# Patient Record
Sex: Male | Born: 1970 | Race: White | Hispanic: No | Marital: Married | Smoking: Never smoker
Health system: Southern US, Community
[De-identification: ages and names within clinical notes are randomized; demographics above are authoritative.]

## PROBLEM LIST (undated history)

## (undated) DIAGNOSIS — I10 Essential (primary) hypertension: Secondary | ICD-10-CM

## (undated) DIAGNOSIS — F99 Mental disorder, not otherwise specified: Secondary | ICD-10-CM

## (undated) DIAGNOSIS — T7840XA Allergy, unspecified, initial encounter: Secondary | ICD-10-CM

## (undated) DIAGNOSIS — T4145XA Adverse effect of unspecified anesthetic, initial encounter: Secondary | ICD-10-CM

## (undated) DIAGNOSIS — R51 Headache: Secondary | ICD-10-CM

## (undated) DIAGNOSIS — T8859XA Other complications of anesthesia, initial encounter: Secondary | ICD-10-CM

## (undated) HISTORY — PX: TONSILLECTOMY: SUR1361

## (undated) HISTORY — PX: SPINE SURGERY: SHX786

## (undated) HISTORY — DX: Allergy, unspecified, initial encounter: T78.40XA

## (undated) HISTORY — PX: KNEE ARTHROSCOPY: SUR90

---

## 2011-03-09 ENCOUNTER — Ambulatory Visit (INDEPENDENT_AMBULATORY_CARE_PROVIDER_SITE_OTHER): Payer: BC Managed Care – PPO

## 2011-03-09 DIAGNOSIS — J159 Unspecified bacterial pneumonia: Secondary | ICD-10-CM

## 2011-04-17 NOTE — H&P (Addendum)
David Gonzalez 04/17/2011 2:35 PM Location: [facility] Patient #: [id number] DOB: February 10, 1971 Married / Language: [place] / Race: White Male   History of Present Illness(DIANA Dierdre Highman, PA-C; 04/17/2011 3:12 PM) The patient is a 41 year old male who comes in today for a preoperative History and Physical. 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 Debria Garret D. Shon Baton, MD at [facility] on Wednesday, April 29, 2010 at 8:30AM .    Problem List/Past Medical(DIANA Dierdre Highman, PA-C; 04/17/2011 2:52 PM) Cervical radiculopathy (723.4) Pain, Cervical (723.1) *RIGHT. 07/25/1985 *SPRAIN/STRAIN UNSPEC.KNEE & LEG. 07/25/1985 *BILATERAL. 09/28/1985 Cellulitis/abscess, toe, onychia/paronychia (681.11). 09/28/1985 Sprain/strain, foot NOS (845.10). 05/09/1987 Derangement, internal, knee NOS (717.9). 10/19/1987 Derangement, meniscus NEC (717.5). 11/07/1987 Tear, lateral meniscus, knee, current (836.1). 11/07/1987 Tear, medial meniscus, knee, current (836.0). 11/07/1987 Sprain/strain, ankle NOS (845.00). 06/10/1990 *LEFT. 06/10/1990 *FX PHALANX MIDDLE CLOSED. 10/29/1993 Sprain/strain, wrist NOS (842.00). 03/13/1994 Sprain/strain, lumbar region (847.2). 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 08/08/1996 VERIFY WITH PT. RELAFEN. 08/08/1996 VERIFY W/PT. HYDROCODONE. 05/18/2009 CODEINE. 05/18/2009   Family History(DIANA J KOVACH, PA-C; 04/17/2011 2:52 PM) Bleeding disorder. mother Cancer. father Diabetes Mellitus. mother Rheumatoid Arthritis. mother Hypertension. mother, father and sister   Social History(DIANA Dierdre Highman, PA-C; 04/17/2011 2:52 PM) Drug/Alcohol Rehab (Currently). no Living situation. live with spouse Tobacco / smoke exposure. no Illicit drug use. no Children. 2 Current work status. working full  time Tobacco use. never smoker Alcohol use. never consumed alcohol Marital status. married Number of flights of stairs before winded. 4-5 Pain Contract. no Exercise. Exercises rarely Drug/Alcohol Rehab (Previously). no   Medication History(DIANA J KOVACH, PA-C; 04/17/2011 2:50 PM) Lisinopril (10MG  Tablet, 1 Oral daily) Active. Claritin (1 Oral daily) Specific dose unknown - Active. TraMADol HCl (50MG  Tablet, 1 Oral q 6 hours) Active.   Past Surgical History(DIANA J [facility], PA-C; 04/17/2011 2:52 PM) Arthroscopy of Knee. right Straighten Nasal Septum   Other Problems(DIANA J KOVACH, PA-C; 04/17/2011 2:52 PM) High blood pressure   Review of Systems(DIANA J KOVACH, PA-C; 04/17/2011 2:37 PM) General:Not Present- Chills, Fever, Night Sweats, Appetite Loss, Fatigue, Feeling sick, Weight Gain and Weight Loss. Skin:Not Present- Itching, Rash, Skin Color Changes, Ulcer, Psoriasis and Change in Hair or Nails. HEENT:Not Present- Sensitivity to light, Hearing problems, Nose Bleed and Ringing in the Ears. Neck:Not Present- Swollen Glands and Neck Mass. Respiratory:Not Present- Snoring, Chronic Cough, Bloody sputum and Dyspnea. Cardiovascular:Not Present- Shortness of Breath, Chest Pain, Swelling of Extremities, Leg Cramps and Palpitations. Gastrointestinal:Not Present- Bloody Stool, Heartburn, Abdominal Pain, Vomiting, Nausea and Incontinence of Stool. Male Genitourinary:Not Present- Blood in Urine, Frequency, Incontinence and Nocturia. Musculoskeletal:Present- Muscle Pain. Not Present- Muscle Weakness, Joint Stiffness, Joint Swelling, Joint Pain and Back Pain. Neurological:Present- Tingling. Not Present- Numbness, Burning, Tremor, Headaches and Dizziness. Psychiatric:Not Present- Anxiety, Depression and Memory Loss. Endocrine:Not Present- Cold Intolerance, Heat Intolerance, Excessive hunger and Excessive Thirst. Hematology:Not Present- Abnormal Bleeding, Anemia,  Blood Clots and Easy Bruising.   Physical Exam(DIANA J [facility], PA-C; 04/17/2011 3:23 PM) The physical exam findings are as follows:   General General Appearance- pleasant. Not in acute distress. Orientation- Oriented X3. Build & Nutrition- Well nourished and Well developed. Posture- Normal posture. Gait- Normal. Mental Status- Alert.   Integumentary General Characteristics:Surgical Scars- no surgical scar evidence of previous cervical surgery. Cervical Spine- Skin examination of the cervical spine is without deformity, skin lesions, lacerations or abrasions.   Head and Neck  Neck Global Assessment- supple. no lymphadenopathy and no nucchal rigidty.   Eye Pupil- Bilateral- Normal, Direct reaction to light normal, Equal and Regular. Motion- Bilateral- EOMI.   Chest and Lung Exam Auscultation: Breath sounds:- Clear.   Cardiovascular Auscultation:Rhythm- Regular rate and rhythm. Heart Sounds- Normal heart sounds.   Abdomen Palpation/Percussion:Palpation and Percussion of the abdomen reveal - Non Tender, No Rebound tenderness and Soft.   Peripheral Vascular Upper Extremity: Palpation:Radial pulse- Bilateral- 2+.   Neurologic Sensation:Upper Extremity- Bilateral- sensation is intact in the upper extremity. Lower Extremity- Bilateral- sensation is intact in the lower extremity. Reflexes:Biceps Reflex- Bilateral- 1+. Brachioradialis Reflex- Bilateral- 1+. Triceps Reflex- Bilateral- 1+. Babinski- Bilateral- Babinski not present. Clonus- Bilateral- clonus not present. Hoffman's Sign- Bilateral- Hoffman's sign not present.   Musculoskeletal Spine/Ribs/Pelvis Cervical Spine : Inspection and Palpation:Tenderness- no soft tissue tenderness to palpation and no bony tenderness to palpation. bony/soft tissue palpation of the cervical spine and shoulders does not recreate their typical pain. Strength and  Tone: Strength:Deltoid- Bilateral- 5/5. Biceps- Bilateral- 5/5. Triceps- Bilateral- 5/5. Wrist Extension- Bilateral- 5/5. Hand Grip- Bilateral- 5/5. Heel walk- Bilateral- able to heel walk without difficulty. Toe Walk- Bilateral- able to walk on toes without difficulty. Heel-Toe Walk- Bilateral- able to heel-toe walk without difficulty. ROM- Flexion- Moderately Decreased and painful. Extension- Moderately Decreased and painful. Left Lateral Flexion - Moderately Decreased and painful. Right Lateral Flexion - Moderately Decreased and painful. Left Rotation - Moderately Decreased and painful. Right Rotation - Moderately Decreased and painful. Pain:- neither flexion or extension is more painful than the other. Special Testing- axial compression test negative and cross chest impingement test negative. Non-Anatomic Signs- No non-anatomic signs present. Upper Extremity Range of Motion:- No truesholder pain with IR/ER of the shoulders.   Assessment & Plan(DIANA J [facility], PA-C; 04/17/2011 3:08 PM) Note: unfortunately conservative measures consisting of observation, activity modification, oral pain medication and injection therapy have failed to alleviate his symptoms and given the ongoing nature of his pain and the decrease in his quality of life, he wishes to proceed with surgery. Risk/benefits/alternatives/expectations following surgery have been reviewed with the patient by Dr. Shon Baton.  MRI of the cervical spine dated 03/07/11 demonstrates moderate right sided disc herniation. Moderate right posterolateral herniation extends into the right neural foramen. Sever right foraminal stenosis. Asymmetric right lateral recess stenosis with mass effect on the C6 root.   He has not yet been contacted about the ASPEN collar and I have informed him that our physical therapy department likely will be contacting him to set up an appointment. He is scheduled to complete his pre-op hospital  requirements next Thursday.   All of his questions were encouraged, addressed and answered. Plan, at this time is to proceed with surgery as scheduled.   Signed electronically by Gwinda Maine, PA-C (04/17/2011 3:29 PM)  David Gonzalez 04/03/2011 3:51 PM Location: [facility] Patient #: [id number] DOB: 1970/05/30 Married / Language: English / Race: White Male   History of Present Illness(Debbie G Vivona; 04/03/2011 3:51 PM) The patient is a 41 year old male who presents today for follow up of their neck. The patient is being followed for their neck pain and 2 weeks s/p esi.    Subjective Transcription(Nailea Whitehorn Sheela Stack, MD; 04/04/2011 2:05 PM)  I have had a very long discussion with the patient and his wife concerning his cervical pain. At this point he states that he only had temporary relief with the injection and his overall pain and quality of life is  progressively getting worse. AT this point even though there is some disease at the 4-5 and 6-7 levels, it is not severe and I do not think it is worthwhile doing a multilevel procedure.      Allergies(Debbie G Vivona; 04/03/2011 3:52 PM) HYDROCODONE. 05/18/2009 CODEINE. 05/18/2009 VICODIN. 08/08/1996 VERIFY WITH PT. RELAFEN. 08/08/1996 VERIFY W/PT.   Social History(Debbie G Vivona; 04/03/2011 3:52 PM) Drug/Alcohol Rehab (Previously). no Exercise. Exercises rarely Illicit drug use. no Children. 2 Current work status. working full time Drug/Alcohol Rehab (Currently). no Living situation. live with spouse Tobacco / smoke exposure. no Tobacco use. never smoker Marital status. married Number of flights of stairs before winded. 4-5 Pain Contract. no Alcohol use. never consumed alcohol   Medication History(Debbie G Vivona; 04/03/2011 3:52 PM) Claritin ( Oral) Specific dose unknown - Active. Ibuprofen (800MG  Tablet, Oral) Active. Lisinopril (10MG  Tablet, Oral) Active. TraMADol HCl (50MG   Tablet, 1 (one) Oral three times daily, Taken starting 03/14/2011) Active. Valium (5MG  Tablet, 1 Oral take 1 tablet 1 hour before exam. take second tablet 30 mins before exam., Taken starting 03/04/2011) Active.   Vitals(Debbie G Vivona; 04/03/2011 3:52 PM) 04/03/2011 3:52 PM Weight: 347 lb Height: 81 in Body Surface Area: 3 m Body Mass Index: 37.18 kg/m    Objective Transcription(Ashly Yepez D Eyvette Cordon, MD; 04/04/2011 2:05 PM)  He has radicular arm pain in the C6 distribution. He has 5-6 hard disc osteophyte causing nerve compression on his MRI. He has failed conservative management consisting of injection therapy, physical therapy, activity modifications and medications.      Assessment & Plan(DIANA J [facility], PA-C; 04/03/2011 10:12 PM) Cervical radiculopathy (723.4) Current Plans l Risks of surgery include, but are not limited to: Throat pain,swallowing difficulty, hoarseness or change in voice, Death, stroke, paralysis, nerve root damage/injury, bleeding, blood clots, loss of bowel/bladder control, hardware failure, or malposition, spinal fluid leak, adjacent segment disease, non-union, need for further surgery, ongoing or worse pain, infection and recurrent disc herniation  l We have gone over the risks and benefits of surgery, which include infection, bleeding, nerve damage, death, stroke, paralysis, failure to heal, need for further surgery, ongoing or worse pain, loss of fixation, need for further surgery, CSF leak, loss of bowel or bladder control, ongoing or worse pain.   Pain, Cervical (723.1)  Note: follow up following right C6 SNRB. Unfortunately only about 4 days of decreased not alleviated pain.  At this time, the patient feels he can no longer continue in his current state of pain and wishes to proceed with surgical intervention. Dr. Shon Baton has discussed with the patient and his wife the results of the MRI and the surgical procedure of choice which would be  an ACDF C5-6. The surgical procedure was discussed in great detail by both myself and Dr. Shon Baton with the patient and his wife. All of their questions were encouraged, addressed and answered. I have provided then with the webpage for them to review as well as our email address for additional questions that may arise. We will go ahead and begin the surgical booking process at this time per the patients request.   Plans Transcription(Adalyn Pennock D Ples Trudel, MD; 04/04/2011 2:05 PM)  I think he is a good candidate for surgical intervention. I have discussed the risks which include infection, bleeding, nerve damage, death, stroke, paralysis, failure to heal, need for further surgery, ongoing or worse pain, loss of bowel and bladder control, nonunion, adjacent segment disease, throat pain, swallowing difficulties, hoarseness in the voice. Both he and  his wife have expressed complete understanding of the risks and benefits and expectations. All of their questions were encouraged and addressed.      Miscellaneous Transcription(Winfrey Chillemi Sheela Stack, MD; 04/04/2011 2:05 PM)  Alvy Beal, MD/srd    T: 04/04/11  D: 04/03/11      Signed electronically by Alvy Beal, MD (04/08/2011 2:13 PM)  No change in clinical exam. Reviewed procedure Plan on ACDF C5/6

## 2011-04-18 ENCOUNTER — Encounter (HOSPITAL_COMMUNITY): Payer: Self-pay | Admitting: Respiratory Therapy

## 2011-04-24 ENCOUNTER — Other Ambulatory Visit: Payer: Self-pay

## 2011-04-24 ENCOUNTER — Ambulatory Visit (HOSPITAL_COMMUNITY)
Admission: RE | Admit: 2011-04-24 | Discharge: 2011-04-24 | Disposition: A | Discharge status: Home / Self Care | Payer: BC Managed Care – PPO | Source: Ambulatory Visit | Attending: Anesthesiology | Admitting: Anesthesiology

## 2011-04-24 ENCOUNTER — Ambulatory Visit (HOSPITAL_COMMUNITY)
Admission: RE | Admit: 2011-04-24 | Discharge: 2011-04-24 | Disposition: A | Discharge status: Home / Self Care | Payer: BC Managed Care – PPO | Source: Ambulatory Visit | Attending: Physician Assistant | Admitting: Physician Assistant

## 2011-04-24 ENCOUNTER — Encounter (HOSPITAL_COMMUNITY): Payer: Self-pay

## 2011-04-24 ENCOUNTER — Encounter (HOSPITAL_COMMUNITY)
Admission: RE | Admit: 2011-04-24 | Discharge: 2011-04-24 | Payer: BC Managed Care – PPO | Source: Ambulatory Visit | Attending: Orthopedic Surgery | Admitting: Orthopedic Surgery

## 2011-04-24 DIAGNOSIS — M47812 Spondylosis without myelopathy or radiculopathy, cervical region: Secondary | ICD-10-CM | POA: Insufficient documentation

## 2011-04-24 DIAGNOSIS — Z01812 Encounter for preprocedural laboratory examination: Secondary | ICD-10-CM | POA: Insufficient documentation

## 2011-04-24 DIAGNOSIS — Z01818 Encounter for other preprocedural examination: Secondary | ICD-10-CM | POA: Insufficient documentation

## 2011-04-24 DIAGNOSIS — Z01810 Encounter for preprocedural cardiovascular examination: Secondary | ICD-10-CM | POA: Insufficient documentation

## 2011-04-24 HISTORY — DX: Adverse effect of unspecified anesthetic, initial encounter: T41.45XA

## 2011-04-24 HISTORY — DX: Other complications of anesthesia, initial encounter: T88.59XA

## 2011-04-24 HISTORY — DX: Essential (primary) hypertension: I10

## 2011-04-24 HISTORY — DX: Mental disorder, not otherwise specified: F99

## 2011-04-24 HISTORY — DX: Headache: R51

## 2011-04-24 LAB — SURGICAL PCR SCREEN
MRSA, PCR: NEGATIVE
Staphylococcus aureus: NEGATIVE

## 2011-04-24 LAB — BASIC METABOLIC PANEL
BUN: 14 mg/dL (ref 6–23)
Creatinine, Ser: 0.94 mg/dL (ref 0.50–1.35)
GFR calc Af Amer: >90 mL/min (ref >90)
GFR calc non Af Amer: >90 mL/min (ref >90)

## 2011-04-24 LAB — CBC
MCHC: 34.2 g/dL (ref 30.0–36.0)
Platelets: 234 10*3/uL (ref 150–400)
RDW: 12.4 % (ref 11.5–15.5)
WBC: 6.8 10*3/uL (ref 4.0–10.5)

## 2011-04-24 NOTE — Pre-Procedure Instructions (Signed)
David Gonzalez  04/24/2011   Your procedure is scheduled on: wed jan 30 Report to [facility] a0630am.  Call this number if you have problems the morning of surgery: [phone]   Remember:   Do not eat food:After Midnight.  May have clear liquids: up to 4 Hours before arrival.  Clear liquids include soda, tea, black coffee, apple or grape juice, broth.  Take these medicines the morning of surgery with A SIP OF WATER:tramadol,claritin  Do not wear jewelry, make-up or nail polish.  Do not wear lotions, powders, or perfumes. You may wear deodorant.  Do not shave 48 hours prior to surgery.  Do not bring valuables to the hospital.  Contacts, dentures or bridgework may not be worn into surgery.  Leave suitcase in the car. After surgery it may be brought to your room.  For patients admitted to the hospital, checkout time is 11:00 AM the day of discharge.   Patients discharged the day of surgery will not be allowed to drive home.  Name and phone number of your driver: spose  Special Instructions: CHG Shower Use Special Wash: 1/2 bottle night before surgery and 1/2 bottle morning of surgery.   Please read over the following fact sheets that you were given: Lab Information, MRSA Information and Surgical Site Infection Prevention

## 2011-04-29 MED ORDER — CEFAZOLIN SODIUM-DEXTROSE 2-3 GM-% IV SOLR
2.0000 g | INTRAVENOUS | Status: AC
  Administered 2011-04-30: 2 g via INTRAVENOUS
  Filled 2011-04-29: qty 50

## 2011-04-29 MED ORDER — LACTATED RINGERS IV SOLN: INTRAVENOUS | Status: DC

## 2011-04-30 ENCOUNTER — Ambulatory Visit (HOSPITAL_COMMUNITY): Payer: BC Managed Care – PPO | Admitting: Anesthesiology

## 2011-04-30 ENCOUNTER — Encounter (HOSPITAL_COMMUNITY): Payer: Self-pay | Admitting: *Deleted

## 2011-04-30 ENCOUNTER — Encounter (HOSPITAL_COMMUNITY): Payer: Self-pay | Admitting: Anesthesiology

## 2011-04-30 ENCOUNTER — Encounter (HOSPITAL_COMMUNITY)
Admission: RE | Disposition: A | Discharge status: Home / Self Care | Payer: Self-pay | Source: Ambulatory Visit | Attending: Orthopedic Surgery

## 2011-04-30 ENCOUNTER — Ambulatory Visit (HOSPITAL_COMMUNITY): Payer: BC Managed Care – PPO

## 2011-04-30 ENCOUNTER — Ambulatory Visit (HOSPITAL_COMMUNITY)
Admission: RE | Admit: 2011-04-30 | Discharge: 2011-05-01 | Disposition: A | Discharge status: Home / Self Care | Payer: BC Managed Care – PPO | Source: Ambulatory Visit | Attending: Orthopedic Surgery | Admitting: Orthopedic Surgery

## 2011-04-30 DIAGNOSIS — Z79899 Other long term (current) drug therapy: Secondary | ICD-10-CM | POA: Insufficient documentation

## 2011-04-30 DIAGNOSIS — Z01818 Encounter for other preprocedural examination: Secondary | ICD-10-CM | POA: Insufficient documentation

## 2011-04-30 DIAGNOSIS — I10 Essential (primary) hypertension: Secondary | ICD-10-CM | POA: Insufficient documentation

## 2011-04-30 DIAGNOSIS — M47812 Spondylosis without myelopathy or radiculopathy, cervical region: Secondary | ICD-10-CM | POA: Insufficient documentation

## 2011-04-30 DIAGNOSIS — M5030 Other cervical disc degeneration, unspecified cervical region: Secondary | ICD-10-CM

## 2011-04-30 HISTORY — PX: ANTERIOR CERVICAL DECOMP/DISCECTOMY FUSION: SHX1161

## 2011-04-30 SURGERY — ANTERIOR CERVICAL DECOMPRESSION/DISCECTOMY FUSION 1 LEVEL
Anesthesia: General | Site: Neck | Wound class: Clean

## 2011-04-30 MED ORDER — HETASTARCH-ELECTROLYTES 6 % IV SOLN
INTRAVENOUS | Status: DC | PRN
  Administered 2011-04-30: 09:00:00 via INTRAVENOUS

## 2011-04-30 MED ORDER — METHOCARBAMOL 100 MG/ML IJ SOLN
500.0000 mg | Freq: Four times a day (QID) | INTRAMUSCULAR | Status: DC | PRN
  Administered 2011-04-30: 500 mg via INTRAVENOUS
  Filled 2011-04-30: qty 5

## 2011-04-30 MED ORDER — LORATADINE 10 MG PO TABS
10.0000 mg | ORAL_TABLET | Freq: Every day | ORAL | Status: DC
  Administered 2011-05-01: 10 mg via ORAL
  Filled 2011-04-30: qty 1

## 2011-04-30 MED ORDER — LISINOPRIL 10 MG PO TABS
10.0000 mg | ORAL_TABLET | Freq: Every day | ORAL | Status: DC
  Administered 2011-04-30 – 2011-05-01 (×2): 10 mg via ORAL
  Filled 2011-04-30 (×2): qty 1

## 2011-04-30 MED ORDER — GLYCOPYRROLATE 0.2 MG/ML IJ SOLN
INTRAMUSCULAR | Status: DC | PRN
  Administered 2011-04-30: 1 mg via INTRAVENOUS

## 2011-04-30 MED ORDER — CEFAZOLIN SODIUM 1-5 GM-% IV SOLN
1.0000 g | Freq: Three times a day (TID) | INTRAVENOUS | Status: AC
  Administered 2011-04-30 (×2): 1 g via INTRAVENOUS
  Filled 2011-04-30 (×2): qty 50

## 2011-04-30 MED ORDER — FENTANYL CITRATE 0.05 MG/ML IJ SOLN
INTRAMUSCULAR | Status: DC | PRN
  Administered 2011-04-30: 100 ug via INTRAVENOUS
  Administered 2011-04-30: 50 ug via INTRAVENOUS
  Administered 2011-04-30: 100 ug via INTRAVENOUS

## 2011-04-30 MED ORDER — ONDANSETRON HCL 4 MG/2ML IJ SOLN: 4.0000 mg | INTRAMUSCULAR | Status: DC | PRN

## 2011-04-30 MED ORDER — THROMBIN 20000 UNITS EX KIT
PACK | OROMUCOSAL | Status: DC | PRN
  Administered 2011-04-30: 09:00:00 via TOPICAL

## 2011-04-30 MED ORDER — BUPIVACAINE-EPINEPHRINE 0.25% -1:200000 IJ SOLN
INTRAMUSCULAR | Status: DC | PRN
  Administered 2011-04-30: 4 mL

## 2011-04-30 MED ORDER — PROMETHAZINE HCL 25 MG/ML IJ SOLN: 6.2500 mg | INTRAMUSCULAR | Status: DC | PRN

## 2011-04-30 MED ORDER — SODIUM CHLORIDE 0.9 % IJ SOLN: 3.0000 mL | INTRAMUSCULAR | Status: DC | PRN

## 2011-04-30 MED ORDER — LACTATED RINGERS IV SOLN
INTRAVENOUS | Status: DC
  Administered 2011-05-01: 03:00:00 via INTRAVENOUS

## 2011-04-30 MED ORDER — OXYCODONE HCL 5 MG PO TABS
10.0000 mg | ORAL_TABLET | ORAL | Status: DC | PRN
  Administered 2011-04-30 – 2011-05-01 (×6): 10 mg via ORAL
  Filled 2011-04-30 (×6): qty 2

## 2011-04-30 MED ORDER — DEXAMETHASONE 4 MG PO TABS
4.0000 mg | ORAL_TABLET | Freq: Four times a day (QID) | ORAL | Status: DC
  Administered 2011-04-30 – 2011-05-01 (×3): 4 mg via ORAL
  Filled 2011-04-30 (×10): qty 1

## 2011-04-30 MED ORDER — LACTATED RINGERS IV SOLN: INTRAVENOUS | Status: DC

## 2011-04-30 MED ORDER — PHENOL 1.4 % MT LIQD
1.0000 | OROMUCOSAL | Status: DC | PRN
  Filled 2011-04-30: qty 177

## 2011-04-30 MED ORDER — SODIUM CHLORIDE 0.9 % IJ SOLN
3.0000 mL | Freq: Two times a day (BID) | INTRAMUSCULAR | Status: DC
  Administered 2011-05-01: 3 mL via INTRAVENOUS

## 2011-04-30 MED ORDER — LACTATED RINGERS IV SOLN
INTRAVENOUS | Status: DC | PRN
  Administered 2011-04-30 (×2): via INTRAVENOUS

## 2011-04-30 MED ORDER — SUCCINYLCHOLINE CHLORIDE 20 MG/ML IJ SOLN
INTRAMUSCULAR | Status: DC | PRN
  Administered 2011-04-30: 160 mg via INTRAVENOUS

## 2011-04-30 MED ORDER — ZOLPIDEM TARTRATE 10 MG PO TABS: 10.0000 mg | ORAL_TABLET | Freq: Every evening | ORAL | Status: DC | PRN

## 2011-04-30 MED ORDER — ONDANSETRON HCL 4 MG/2ML IJ SOLN
INTRAMUSCULAR | Status: DC | PRN
  Administered 2011-04-30: 4 mg via INTRAVENOUS

## 2011-04-30 MED ORDER — DEXTROSE 5 % IV SOLN
500.0000 mg | INTRAVENOUS | Status: DC
  Filled 2011-04-30: qty 5

## 2011-04-30 MED ORDER — MORPHINE SULFATE 4 MG/ML IJ SOLN
1.0000 mg | INTRAMUSCULAR | Status: DC | PRN
  Administered 2011-04-30: 2 mg via INTRAVENOUS
  Filled 2011-04-30: qty 1

## 2011-04-30 MED ORDER — MENTHOL 3 MG MT LOZG: 1.0000 | LOZENGE | OROMUCOSAL | Status: DC | PRN

## 2011-04-30 MED ORDER — DEXAMETHASONE SODIUM PHOSPHATE 10 MG/ML IJ SOLN
10.0000 mg | Freq: Once | INTRAMUSCULAR | Status: AC
  Administered 2011-04-30: 10 mg via INTRAVENOUS

## 2011-04-30 MED ORDER — METHOCARBAMOL 500 MG PO TABS
500.0000 mg | ORAL_TABLET | Freq: Four times a day (QID) | ORAL | Status: DC | PRN
  Administered 2011-04-30 – 2011-05-01 (×3): 500 mg via ORAL
  Filled 2011-04-30 (×4): qty 1

## 2011-04-30 MED ORDER — ROCURONIUM BROMIDE 100 MG/10ML IV SOLN
INTRAVENOUS | Status: DC | PRN
  Administered 2011-04-30: 50 mg via INTRAVENOUS

## 2011-04-30 MED ORDER — MEPERIDINE HCL 25 MG/ML IJ SOLN: 6.2500 mg | INTRAMUSCULAR | Status: DC | PRN

## 2011-04-30 MED ORDER — DEXAMETHASONE SODIUM PHOSPHATE 4 MG/ML IJ SOLN
4.0000 mg | Freq: Four times a day (QID) | INTRAMUSCULAR | Status: DC
  Administered 2011-04-30: 4 mg via INTRAVENOUS
  Filled 2011-04-30 (×8): qty 1

## 2011-04-30 MED ORDER — ACETAMINOPHEN 10 MG/ML IV SOLN
1000.0000 mg | Freq: Four times a day (QID) | INTRAVENOUS | Status: AC
  Administered 2011-04-30 – 2011-05-01 (×3): 1000 mg via INTRAVENOUS
  Filled 2011-04-30 (×4): qty 100

## 2011-04-30 MED ORDER — 0.9 % SODIUM CHLORIDE (POUR BTL) OPTIME
TOPICAL | Status: DC | PRN
  Administered 2011-04-30: 1000 mL

## 2011-04-30 MED ORDER — FENTANYL CITRATE 0.05 MG/ML IJ SOLN
25.0000 ug | INTRAMUSCULAR | Status: DC | PRN
  Administered 2011-04-30 (×3): 50 ug via INTRAVENOUS

## 2011-04-30 MED ORDER — VECURONIUM BROMIDE 10 MG IV SOLR
INTRAVENOUS | Status: DC | PRN
  Administered 2011-04-30: 2 mg via INTRAVENOUS
  Administered 2011-04-30 (×2): 1 mg via INTRAVENOUS
  Administered 2011-04-30: 2 mg via INTRAVENOUS

## 2011-04-30 MED ORDER — ACETAMINOPHEN 10 MG/ML IV SOLN
1000.0000 mg | Freq: Once | INTRAVENOUS | Status: AC
  Administered 2011-04-30: 1000 mg via INTRAVENOUS
  Filled 2011-04-30: qty 100

## 2011-04-30 MED ORDER — PROPOFOL 10 MG/ML IV EMUL
INTRAVENOUS | Status: DC | PRN
  Administered 2011-04-30: 400 mg via INTRAVENOUS

## 2011-04-30 MED ORDER — NEOSTIGMINE METHYLSULFATE 1 MG/ML IJ SOLN
INTRAMUSCULAR | Status: DC | PRN
  Administered 2011-04-30: 5 mg via INTRAVENOUS

## 2011-04-30 MED ORDER — SODIUM CHLORIDE 0.9 % IV SOLN: 250.0000 mL | INTRAVENOUS | Status: DC

## 2011-04-30 SURGICAL SUPPLY — 58 items
ADH SKN CLS APL DERMABOND .7 (GAUZE/BANDAGES/DRESSINGS)
BLADE SURG ROTATE 9660 (MISCELLANEOUS) IMPLANT
BUR EGG ELITE 4.0 (BURR) IMPLANT
BUR MATCHSTICK NEURO 3.0 LAGG (BURR) IMPLANT
CANISTER SUCTION 2500CC (MISCELLANEOUS) ×1 IMPLANT
CLOTH BEACON ORANGE TIMEOUT ST (SAFETY) ×2 IMPLANT
CORDS BIPOLAR (ELECTRODE) ×2 IMPLANT
COVER SURGICAL LIGHT HANDLE (MISCELLANEOUS) ×3 IMPLANT
CRADLE DONUT ADULT HEAD (MISCELLANEOUS) ×2 IMPLANT
DERMABOND ADVANCED (GAUZE/BANDAGES/DRESSINGS)
DERMABOND ADVANCED .7 DNX12 (GAUZE/BANDAGES/DRESSINGS) ×1 IMPLANT
DISTRACTION PINS ×2 IMPLANT
DRAPE C-ARM 42X72 X-RAY (DRAPES) ×2 IMPLANT
DRAPE POUCH INSTRU U-SHP 10X18 (DRAPES) ×2 IMPLANT
DRAPE SURG 17X23 STRL (DRAPES) ×2 IMPLANT
DRAPE U-SHAPE 47X51 STRL (DRAPES) ×2 IMPLANT
DRSG MEPILEX BORDER 4X4 (GAUZE/BANDAGES/DRESSINGS) ×2 IMPLANT
DURAPREP 26ML APPLICATOR (WOUND CARE) ×2 IMPLANT
ELECT COATED BLADE 2.86 ST (ELECTRODE) ×2 IMPLANT
ELECT REM PT RETURN 9FT ADLT (ELECTROSURGICAL) ×2
ELECTRODE REM PT RTRN 9FT ADLT (ELECTROSURGICAL) ×1 IMPLANT
ENDOSKELETON LG TC 6VBR 8MM (Orthopedic Implant) ×1 IMPLANT
GLOVE BIOGEL PI IND STRL 6.5 (GLOVE) ×1 IMPLANT
GLOVE BIOGEL PI IND STRL 8.5 (GLOVE) ×1 IMPLANT
GLOVE BIOGEL PI INDICATOR 6.5 (GLOVE) ×1
GLOVE BIOGEL PI INDICATOR 8.5 (GLOVE) ×2
GLOVE ECLIPSE 6.0 STRL STRAW (GLOVE) ×2 IMPLANT
GLOVE ECLIPSE 8.5 STRL (GLOVE) ×2 IMPLANT
GOWN PREVENTION PLUS XXLARGE (GOWN DISPOSABLE) ×3 IMPLANT
GOWN STRL NON-REIN LRG LVL3 (GOWN DISPOSABLE) ×4 IMPLANT
KIT BASIN OR (CUSTOM PROCEDURE TRAY) ×2 IMPLANT
KIT ROOM TURNOVER OR (KITS) ×2 IMPLANT
NDL SPNL 18GX3.5 QUINCKE PK (NEEDLE) ×1 IMPLANT
NEEDLE SPNL 18GX3.5 QUINCKE PK (NEEDLE) ×2 IMPLANT
NS IRRIG 1000ML POUR BTL (IV SOLUTION) ×2 IMPLANT
PACK ORTHO CERVICAL (CUSTOM PROCEDURE TRAY) ×2 IMPLANT
PACK UNIVERSAL I (CUSTOM PROCEDURE TRAY) ×2 IMPLANT
PAD ARMBOARD 7.5X6 YLW CONV (MISCELLANEOUS) ×4 IMPLANT
PIN RETAINER PRODISC 14 MM (PIN) ×2 IMPLANT
PLATE LEVEL 1 TI VECTRA 14MM (Plate) ×1 IMPLANT
PUTTY BONE DBX 2.5 MIS (Bone Implant) ×1 IMPLANT
SCREW 18MM (Screw) ×2 IMPLANT
SCREW 4.0X16MM (Screw) ×2 IMPLANT
SPONGE INTESTINAL PEANUT (DISPOSABLE) ×1 IMPLANT
SPONGE SURGIFOAM ABS GEL 100 (HEMOSTASIS) ×2 IMPLANT
STRIP CLOSURE SKIN 1/2X4 (GAUZE/BANDAGES/DRESSINGS) ×2 IMPLANT
SURGIFLO TRUKIT (HEMOSTASIS) IMPLANT
SUT MNCRL AB 3-0 PS2 18 (SUTURE) ×2 IMPLANT
SUT SILK 2 0 (SUTURE) ×2
SUT SILK 2-0 18XBRD TIE 12 (SUTURE) ×1 IMPLANT
SUT VIC AB 2-0 CT1 18 (SUTURE) ×2 IMPLANT
SYR BULB IRRIGATION 50ML (SYRINGE) ×2 IMPLANT
SYR CONTROL 10ML LL (SYRINGE) IMPLANT
TAPE CLOTH 4X10 WHT NS (GAUZE/BANDAGES/DRESSINGS) ×2 IMPLANT
TAPE UMBILICAL COTTON 1/8X30 (MISCELLANEOUS) ×2 IMPLANT
TOWEL OR 17X24 6PK STRL BLUE (TOWEL DISPOSABLE) ×2 IMPLANT
TOWEL OR 17X26 10 PK STRL BLUE (TOWEL DISPOSABLE) ×2 IMPLANT
WATER STERILE IRR 1000ML POUR (IV SOLUTION) ×2 IMPLANT

## 2011-04-30 NOTE — Preoperative (Signed)
Beta Blockers   Reason not to administer Beta Blockers:Not Applicable. No home beta blockers 

## 2011-04-30 NOTE — Op Note (Signed)
NAMEFERMIN, YAN              ACCOUNT NO.:  [id number]  MEDICAL RECORD NO.:  [id number]  LOCATION:  [facility]                         FACILITY:  [facility]  PHYSICIAN:  Alvy Beal, MD    DATE OF BIRTH:  01-Jun-1970  DATE OF PROCEDURE:  04/30/2011 DATE OF DISCHARGE:                              OPERATIVE REPORT   PREOPERATIVE DIAGNOSIS:  Cervical spondylotic radiculopathy, C5-6.  POSTOPERATIVE DIAGNOSIS:  Cervical spondylotic radiculopathy, C5-6.  OPERATIVE PROCEDURE:  Anterior cervical diskectomy and fusion, C5-6 utilizing the Titan titanium intervertebral cage, size 8 mm lordotic large with a 14-mm anterior cervical Synthes Vectra plate with 96-EA screws into the body of C5 and 16-mm screws into the body of C6.  COMPLICATIONS:  None.  CONDITION:  Stable.  No adverse intraoperative events.  FIRST ASSISTANT:  Norval Gable, [place].  HISTORY:  This is a very pleasant 41 year old gentleman, who has been having progressive neck and radicular right arm pain.  Attempts at conservative management have failed to alleviate his symptoms and so we elected to proceed with surgery.  All appropriate risks, benefits, and alternatives were discussed with the patient and consent was obtained.  OPERATIVE NOTE:  The patient was brought to the operating room and placed supine on the operating table.  After successful induction of general anesthesia, endotracheal intubation, TEDs and SCDs were applied. The shoulders were taped down and the anterior cervical spine was prepped and draped in a standard fashion.  Time-out was then done to confirm patient, procedure, and all other pertinent important data. Once this was completed, I began the surgery.  A transverse incision was centered over the C5-C6 disk space and a sharp incision was made.  I dissected down to the platysma and incised the platysma.  I then sharply dissected along the inner nervous plane between the sternocleidomastoid muscle and the  strap muscles.  I dissected into this plane sharply and then retracted the trachea and esophagus and protected with a finger, identified the carotid sheath and protected that with a finger and continued to dissect through the deep cervical and prevertebral fascia. I then identified the C5-6 disk space.  I placed a needle into the disk space and took an x-ray.  Once I confirmed I was at the appropriate level, I continued my dissection.  I used bipolar electrocautery to mobilize the longus coli muscles from the midbody of C5 to the midbody of C6.  I then placed Caspar retracting blades underneath the longus coli muscles, deflated the endotracheal cuff, expanded the retractor blade, and reinflated the cuff.  I now had excellent visualization of the C5-6 disk space.  I then placed distraction pins into the bodies of C5 and C6 and gently distracted the disk space.  An annulotomy was performed with a 15-blade scalpel, and then using a combination of pituitary rongeurs, curettes, and Kerrison rongeurs, I removed all the disk material.  I then resected the posterior anulus and used a 1-mm Kerrison to remove the osteophytes from the posterior aspect of the vertebral bodies of C5 and C6.  Using a micro nerve hook, I developed a plane underneath the posterior longitudinal ligament and then used a 1-mm Kerrison to resect  the posterior longitudinal ligament.  At this point, I also was able to mobilize the small disk fragments on the right side posterolaterally and removed them.  At this point, I then used my 1-mm Kerrison to undercut the uncovertebral joint release and removed the bone spur from this level.  At this point, I could now freely pass my nerve hook out underneath the uncovertebral joints and underneath the posterior aspect of the vertebral bodies.  There was no further neural tension/compression noted.  I then rasped the endplates until I had bleeding subchondral bone, and then  trialed with the 8-mm large cage.  I was very pleased with this fit and so I took the actual implant, packed it with DBX mix, and malleted to the appropriate depth.  I then removed the distraction pins and used bone wax to seal the bone holes the screws had created.  I then contoured the anterior cervical plate and affixed it with self- drilling screws into the bodies of C6 and C5.  The screws were tightened down appropriately until the locking mechanism engaged.  All 4 screws had excellent purchase.  I then copiously irrigated with normal saline, removed the Caspar retractors, and then checked to ensure the esophagus did not become entrapped beneath the plate.  I then obtained hemostasis using bipolar electrocautery, returned the trachea and esophagus to midline, and then closed the platysma with interrupted 2-0 Vicryl sutures and then 3-0 Monocryl for the skin.  Steri-Strips, dry dressing were applied.  The patient was extubated, transferred to the PACU without incident.  At the end of the case, all needle and sponge counts were correct.  There was no adverse intraoperative events.     Alvy Beal, MD     DDB/MEDQ  D:  04/30/2011  T:  04/30/2011  Job:  [id number]

## 2011-04-30 NOTE — Transfer of Care (Signed)
Immediate Anesthesia Transfer of Care Note  Patient: David Gonzalez  Procedure(s) Performed:  ANTERIOR CERVICAL DECOMPRESSION/DISCECTOMY FUSION 1 LEVEL - ACDF C5-6  Patient Location: PACU  Anesthesia Type: General  Level of Consciousness: awake, alert  and oriented  Airway & Oxygen Therapy: Patient Spontanous Breathing and Patient connected to nasal cannula oxygen  Post-op Assessment: Report given to PACU RN and Post -op Vital signs reviewed and stable  Post vital signs: Reviewed  Complications: No apparent anesthesia complications

## 2011-04-30 NOTE — Anesthesia Preprocedure Evaluation (Addendum)
Anesthesia Evaluation  Patient identified by MRN, date of birth, ID band Patient awake    Reviewed: Allergy & Precautions, H&P , NPO status , Patient's Chart, lab work & pertinent test results, reviewed documented beta blocker date and time   History of Anesthesia Complications (+) Emergence Delirium  Airway Mallampati: II      Dental  (+) Teeth Intact and Dental Advisory Given   Pulmonary pneumonia ,    Pulmonary exam normal       Cardiovascular hypertension, Pt. on medications     Neuro/Psych  Headaches,    GI/Hepatic negative GI ROS, Neg liver ROS,   Endo/Other  Negative Endocrine ROS  Renal/GU negative Renal ROS     Musculoskeletal   Abdominal (+) obese,   Peds  Hematology negative hematology ROS (+)   Anesthesia Other Findings   Reproductive/Obstetrics                          Anesthesia Physical Anesthesia Plan  ASA: II  Anesthesia Plan: General   Post-op Pain Management:    Induction: Intravenous  Airway Management Planned: Oral ETT  Additional Equipment:   Intra-op Plan:   Post-operative Plan: Extubation in OR  Informed Consent: I have reviewed the patients History and Physical, chart, labs and discussed the procedure including the risks, benefits and alternatives for the proposed anesthesia with the patient or authorized representative who has indicated his/her understanding and acceptance.   Dental advisory given  Plan Discussed with: Anesthesiologist  Anesthesia Plan Comments:         Anesthesia Quick Evaluation

## 2011-04-30 NOTE — Anesthesia Postprocedure Evaluation (Signed)
  Anesthesia Post-op Note  Patient: David Gonzalez  Procedure(s) Performed:  ANTERIOR CERVICAL DECOMPRESSION/DISCECTOMY FUSION 1 LEVEL - ACDF C5-6  Patient Location: PACU  Anesthesia Type: General  Level of Consciousness: awake  Airway and Oxygen Therapy: Patient Spontanous Breathing  Post-op Pain: mild  Post-op Assessment: Post-op Vital signs reviewed  Post-op Vital Signs: stable  Complications: No apparent anesthesia complications

## 2011-04-30 NOTE — Brief Op Note (Signed)
04/30/2011  10:34 AM  PATIENT:  David Gonzalez  41 y.o. male  PRE-OPERATIVE DIAGNOSIS:  CERVICLE SPONDYLITIS RADICULOPATHY  POST-OPERATIVE DIAGNOSIS:  cervical spondylitis radiculopathy  PROCEDURE:  Procedure(s): ANTERIOR CERVICAL DECOMPRESSION/DISCECTOMY FUSION 1 LEVEL  SURGEON:  Surgeon(s): Alvy Beal, MD  PHYSICIAN ASSISTANT:   ASSISTANTS: Norval Gable   ANESTHESIA:   general  EBL:  Total I/O In: 1500 [I.V.:1000; IV Piggyback:500] Out: 25 [Blood:25]  BLOOD ADMINISTERED:none  DRAINS: none   LOCAL MEDICATIONS USED:  MARCAINE 4CC  SPECIMEN:  No Specimen  DISPOSITION OF SPECIMEN:  N/A  COUNTS:  YES  TOURNIQUET:  * No tourniquets in log *  DICTATION: .Other Dictation: Dictation Number [id number]  PLAN OF CARE: Admit for overnight observation  PATIENT DISPOSITION:  PACU - hemodynamically stable.

## 2011-04-30 NOTE — Anesthesia Procedure Notes (Signed)
Procedure Name: Intubation Date/Time: 04/30/2011 8:36 AM Performed by: Margaree Mackintosh Pre-anesthesia Checklist: Patient identified, Timeout performed, Emergency Drugs available, Suction available and Patient being monitored Patient Re-evaluated:Patient Re-evaluated prior to inductionOxygen Delivery Method: Circle System Utilized Preoxygenation: Pre-oxygenation with 100% oxygen Intubation Type: IV induction Ventilation: Mask ventilation without difficulty and Oral airway inserted - appropriate to patient size Laryngoscope Size: Hyacinth Meeker and 2 Grade View: Grade III Tube type: Oral Tube size: 8.0 mm Number of attempts: 2 Airway Equipment and Method: stylet Placement Confirmation: ETT inserted through vocal cords under direct vision,  breath sounds checked- equal and bilateral and positive ETCO2 Secured at: 26 cm Tube secured with: Tape Dental Injury: Teeth and Oropharynx as per pre-operative assessment  Difficulty Due To: Difficulty was anticipated, Difficult Airway- due to limited oral opening and Difficult Airway- due to large tongue

## 2011-04-30 NOTE — Progress Notes (Signed)
Pt is ACDF 1 level. Pt repts that he had R arm pain and R neck pain and headaches prior to surgery. Pt repts that preop s/sx are "better to gone" postop. Pt has decreased ROM of neck related to surgery. Pt has a mepilex to anterior neck that is dry and intact. Ice to incision. Aspen collar intact. Neuro check is negative. Pt is to void since surgery. Pt abdomen is soft flat nontender and nondistended. BS faint x 4. Pt denies passing gas. Pt repts LBM last night. Pt denies nausea or vomiting. Pt has PAS and TEDS on. Lungs CTA. Heart rate regular rate and rhythm. No s/sx cardiac or resp distress and no c/o such. Vital signs stable. IS given with verbal instruction. Wife at bedside and supportive.

## 2011-05-01 MED ORDER — ONDANSETRON HCL 4 MG PO TABS: 4.0000 mg | ORAL_TABLET | Freq: Three times a day (TID) | ORAL | Status: AC | PRN

## 2011-05-01 MED ORDER — METHOCARBAMOL 500 MG PO TABS: 500.0000 mg | ORAL_TABLET | Freq: Three times a day (TID) | ORAL | Status: AC

## 2011-05-01 MED ORDER — OXYCODONE-ACETAMINOPHEN 10-325 MG PO TABS: 1.0000 | ORAL_TABLET | Freq: Four times a day (QID) | ORAL | Status: DC | PRN

## 2011-05-01 MED ORDER — OXYCODONE HCL 5 MG PO TABS: 10.0000 mg | ORAL_TABLET | ORAL | Status: AC | PRN

## 2011-05-01 MED ORDER — POLYETHYLENE GLYCOL 3350 17 G PO PACK: 17.0000 g | PACK | Freq: Every day | ORAL | Status: AC

## 2011-05-01 NOTE — Progress Notes (Signed)
CARE MANAGEMENT NOTE 05/01/2011 Discharge planning. Patient has no Home Health needs.

## 2011-05-01 NOTE — Progress Notes (Signed)
David Gonzalez 40 y.o. 04/30/2011  cervical spondylitis radiculopathy  1 Day Post-Op   Subjective doing well without problems Comfortable.  Decreased arm pain  Objective grossly normal neuro exam peripheral pulses symmetrical abdomen is soft without significant tenderness, masses, organomegaly or guarding dressing C/Gonzalez/I  Lab. Results: No results found for this basename:  WBC:2,HGB:2,HCT:2,PLT:2 in the last 72 hours BMET No results found for this basename:  NA:2,K:2,CL:2,CO2:2,GLUCOSE:2,BUN:2,CREATININE:2,CALCIUM:2,CBG:2 in the last 72 hours  No results found for this basename: inr VITALS ---------------------------              05/01/11                     0617        ---------------------------  BP:          108/72        Pulse:         77          Temp:   97.7 F (36.5 C)  Resp:          22         ---------------------------  Dg Chest 2 View  04/24/2011  *RADIOLOGY REPORT*  CHEST - 2 VIEW  Clinical Data: Preoperative respiratory exam for cervical ACDF  Comparison: None.  Findings: The cardiac silhouette, mediastinum, pulmonary vasculature are within normal limits.  Both lungs are clear. There is no acute bony abnormality.  IMPRESSION:  There is no evidence of acute cardiac or pulmonary process.  Original Report Authenticated By: Brandon Melnick, M.Gonzalez.   Dg Cervical Spine 2-3 Views  04/30/2011  *RADIOLOGY REPORT*  Clinical Data: Status post cervical fusion.  CERVICAL SPINE - 2-3 VIEW  Comparison: None.  Findings: AP and cross-table lateral views of the cervical spine demonstrate normal alignment from the skull base through the C7 vertebral bodies.  T1 is not well visualized due to overlying shoulders.  There are vertebral soft tissue contour within normal limits.  No complicating features or fracture identified. postsurgical changes of anterior cervical discectomy and fusion at C5-C6.  IMPRESSION: Immediate postoperative changes of anterior cervical  discectomy and fusion at C5-C6.  No complicating features identified.  Original Report Authenticated By: Britta Mccreedy, M.Gonzalez.   Dg Cervical Spine 2-3 Views  04/30/2011  *RADIOLOGY REPORT*  Clinical Data: C5-6 ACDF.  CERVICAL SPINE - 2-3 VIEW  Comparison: 04/24/2011  Findings: Two intraoperative spot images demonstrate changes of ACDF at C5-6.  Normal alignment.  No hardware or bony complicating feature.  IMPRESSION: C5-6 ACDF.  Original Report Authenticated By: Cyndie Chime, M.Gonzalez.   Dg Cervical Spine 2-3 Views  04/24/2011  *RADIOLOGY REPORT*  Clinical Data: Preop for labeling; cervical fusion  CERVICAL SPINE - 2-3 VIEW  Comparison: None.  Findings: The AP and lateral cervical alignment are normal.  There is mild cervical spondylosis from C4-C6.  Prevertebral soft tissue stripe is normal.  IMPRESSION: C4-C6 spondylosis.  Original Report Authenticated By: Brandon Melnick, M.Gonzalez.     Assessment/ Plan Doing well postoperatively. Plan on Gonzalez/c to home today Voiding spontaneously, positive flatus xrays reviewed - satisfactory F/u 2 weeks Scripts/instructions provided    David Gonzalez 1/31/20138:26 AM

## 2011-05-01 NOTE — Progress Notes (Signed)
Physical Therapy Evaluation Patient Details Name: David Gonzalez MRN: [id number] DOB: 1970/11/29 Today's Date: 05/01/2011  Problem List: There is no problem list on file for this patient.   Past Medical History:  Past Medical History  Diagnosis Date  . Complication of anesthesia     reported to be violent waking from anesthesia at age 41  . Mental disorder     mildly claustrophobic  . Hypertension   . Pneumonia     12/13  . Headache    Past Surgical History:  Past Surgical History  Procedure Date  . Knee arthroscopy   . Tonsillectomy     PT Assessment/Plan/Recommendation PT Assessment Clinical Impression Statement: Pt is a 41 y/o male admitted s/p ACDF C5-6.  Pt is modified independent with all mobility.  No further needs identified.  Signing off without follow-up. PT Recommendation/Assessment: Patent does not need any further PT services No Skilled PT: All education completed;Patient will have necessary level of assist by caregiver at discharge;Patient is modified independent with all activity/mobility PT Recommendation Follow Up Recommendations: No PT follow up Equipment Recommended: None recommended by PT  PT Evaluation Precautions/Restrictions  Precautions Precautions: Other (comment) (cervical) Required Braces or Orthoses: Yes Cervical Brace: Hard collar;Applied in supine position Restrictions Weight Bearing Restrictions: No Prior Functioning  Home Living Lives With: Spouse Type of Home: House Home Layout: One level;Laundry or work area in [employer] Access: Stairs to enter Entrance Stairs-Rails: None Secretary/administrator of Steps: 4 Bathroom Shower/Tub: Health visitor: Pharmacist, community: Yes How Accessible: Accessible via walker Home Adaptive Equipment: None Prior Function Level of Independence: Independent with basic ADLs;Independent with homemaking with ambulation;Independent with gait;Independent with  transfers Able to Take Stairs?: Reciprically Driving: Yes Vocation: Full time employment 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 works at [employer] Arousal/Alertness: Awake/alert Overall Cognitive Status: Appears within functional limits for tasks assessed Orientation Level: Oriented X4 Sensation/Coordination Sensation Light Touch: Appears Intact Stereognosis: Not tested Hot/Cold: Not tested Proprioception: Not tested Coordination Gross Motor Movements are Fluid and Coordinated: Yes Fine Motor Movements are Fluid and Coordinated: Yes Extremity Assessment RUE Assessment RUE Assessment: Within Functional Limits (~90 degrees shoulder) per OT LUE Assessment LUE Assessment: Within Functional Limits (~90degrees shoulder) per OT RLE Assessment RLE Assessment: Within Functional Limits LLE Assessment LLE Assessment: Within Functional Limits Pain 2/10 in neck.  Pt repositioned. Mobility (including Balance) Bed Mobility Bed Mobility: Yes (Cues for safest sequence to protect neck.) Rolling Right: 6: Modified independent (Device/Increase time) Rolling Left: 6: Modified independent (Device/Increase time) Right Sidelying to Sit: 6: Modified independent (Device/Increase time) Sit to Sidelying Right: 6: Modified independent (Device/Increase time) Transfers Transfers: Yes Sit to Stand: 6: Modified independent (Device/Increase time) Stand to Sit: 6: Modified independent (Device/Increase time) Ambulation/Gait Ambulation/Gait: Yes Ambulation/Gait Assistance: 6: Modified independent (Device/Increase time) Ambulation Distance (Feet): 350 Feet Assistive device: None Gait Pattern: Within Functional Limits Stairs: Yes Stairs Assistance: 6: Modified independent (Device/Increase time) Stair Management Technique: One rail Right (pt to use wife's hand as rail at d/c) Number of Stairs: 3  Height of Stairs: 4  Wheelchair Mobility Wheelchair  Mobility: No  Posture/Postural Control Posture/Postural Control: No significant limitations Balance Balance Assessed: No Dynamic Gait Index Level Surface: Normal Change in Gait Speed: Normal Gait with Horizontal Head Turns: Normal Gait with Vertical Head Turns: Normal Gait and Pivot Turn: Normal Step Over Obstacle: Normal Step Around Obstacles: Normal Steps: Mild Impairment Total Score: 23  End of Session PT -  End of Session Equipment Utilized During Treatment: Gait belt;Cervical collar Activity Tolerance: Patient tolerated treatment well Patient left: in bed;with call bell in reach;with family/visitor present Nurse Communication: Mobility status for transfers;Mobility status for ambulation General Behavior During Session: [facility] for tasks performed Cognition: [facility] for tasks performed  Cephus Shelling 05/01/2011, 12:45 PM  05/01/2011 Cephus Shelling, PT, DPT [phone]

## 2011-05-01 NOTE — Evaluation (Signed)
Occupational Therapy Evaluation Patient Details Name: David Gonzalez MRN: [id number] DOB: 04-16-1970 Today's Date: 05/01/2011  Problem List: There is no problem list on file for this patient.   Past Medical History:  Past Medical History  Diagnosis Date  . Complication of anesthesia     reported to be violent waking from anesthesia at age 41  . Mental disorder     mildly claustrophobic  . Hypertension   . Pneumonia     12/13  . Headache    Past Surgical History:  Past Surgical History  Procedure Date  . Knee arthroscopy   . Tonsillectomy     OT Assessment/Plan/Recommendation OT Assessment Clinical Impression Statement: Pt. presents s/p cervical surgery and with increased pain and ROM limitations affecting independence with ADLs. All education complete and recommend D/C home. OT Recommendation/Assessment: Patient does not need any further OT services OT Recommendation Follow Up Recommendations: No OT follow up Equipment Recommended: None recommended by OT OT Goals  None.  OT Evaluation Precautions/Restrictions  Precautions Precautions: Other (comment) (cervical) Required Braces or Orthoses: Yes Cervical Brace: Hard collar;Applied in supine position Restrictions Weight Bearing Restrictions: No Prior Functioning Home Living Lives With: Spouse Type of Home: House Home Layout: One level;Laundry or work area in [employer] Access: Stairs to enter Entrance Stairs-Rails: None Secretary/administrator of Steps: 4 Bathroom Shower/Tub: Health visitor: Pharmacist, community: Yes How Accessible: Accessible via walker Home Adaptive Equipment: None Prior Function Level of Independence: Independent with basic ADLs;Independent with homemaking with ambulation;Independent with gait;Independent with transfers Able to Take Stairs?: Reciprically Driving: Yes Vocation: Full time employment 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 works at [employer] ADL ADL Eating/Feeding: Performed;Independent Where Assessed - Eating/Feeding: Edge of bed Grooming: Simulated;Wash/dry hands;Wash/dry face;Modified independent Where Assessed - Grooming: Standing at sink Upper Body Bathing: Simulated;Chest;Right arm;Left arm;Abdomen;Modified independent Where Assessed - Upper Body Bathing: Sitting, bed Lower Body Bathing: Simulated;Modified independent Where Assessed - Lower Body Bathing: Sit to stand from chair Upper Body Dressing: Simulated;Modified independent Where Assessed - Upper Body Dressing: Sitting, chair Lower Body Dressing: Simulated;Modified independent Where Assessed - Lower Body Dressing: Sitting, chair Toilet Transfer: Simulated;Modified independent Toilet Transfer Method: Proofreader: Grab bars;Comfort height toilet Toileting - Clothing Manipulation: Simulated;Modified independent Where Assessed - Toileting Clothing Manipulation: Sit to stand from 3-in-1 or toilet Toileting - Hygiene: Simulated;Modified independent Where Assessed - Toileting Hygiene: Standing Tub/Shower Transfer: Landscape architect Details (indicate cue type and reason): min verbal cues for technique Tub/Shower Transfer Method: Science writer: Shower seat with back Ambulation Related to ADLs: Pt. mod I for increased time ADL Comments: Pt. educated on cervical precautions with ADLs and techniques for increased safety. Pt. provided with handout. Vision/Perception  Vision - History Baseline Vision: No visual deficits Vision - Assessment Eye Alignment: Within Functional Limits Vision Assessment: Vision not tested Cognition Cognition Arousal/Alertness: Awake/alert Overall Cognitive Status: Appears within functional limits for tasks assessed Orientation Level: Oriented X4 Sensation/Coordination Sensation Light Touch: Appears Intact Stereognosis:  Not tested Hot/Cold: Not tested Proprioception: Not tested Coordination Gross Motor Movements are Fluid and Coordinated: Yes Fine Motor Movements are Fluid and Coordinated: Yes Extremity Assessment RUE Assessment RUE Assessment: Within Functional Limits (~90 degrees shoulder) LUE Assessment LUE Assessment: Within Functional Limits (~90degrees shoulder) Mobility  Bed Mobility Bed Mobility: Yes Rolling Right: 6: Modified independent (Device/Increase time) Rolling Left: 6: Modified independent (Device/Increase time) Right Sidelying to Sit: 6: Modified independent (Device/Increase time) Sit to Sidelying  Right: 6: Modified independent (Device/Increase time) Transfers Transfers: Yes Sit to Stand: 6: Modified independent (Device/Increase time) Stand to Sit: 6: Modified independent (Device/Increase time)    End of Session OT - End of Session Equipment Utilized During Treatment: Cervical collar Activity Tolerance: Patient tolerated treatment well Patient left: in bed;with call bell in reach Nurse Communication: Mobility status for ambulation General Behavior During Session: [facility] for tasks performed Cognition: [facility] for tasks performed   David Gonzalez, OTR/L Pager [phone] 05/01/2011, 1:18 PM

## 2011-05-01 NOTE — Progress Notes (Signed)
Pt is s/p ACDF 1 Level. Pt had a mepilex dressing to anterior neck this AM removed per MD. Pt's incision is well approximated with steri strips. No s/sx infection noted. Ice prn to anterior neck incision. Pt is alert and oriented x 3. Neuro check is negative. Pt repts that preop he had R arm, neck pain and HA's that pt repts is "better" postop. Pt repts that he is now having some L shoulder soreness that he repted to MD this AM. Pt lungs CTA. No s/sx resp distess or cardiac distress and no c/o such. Pt repts prod cough but hasn't seen phlegm. Pt performs IS per order. Heart rate regular rate and rhythm. Pt noted to have slight edema of bil hands and BLE. Pt voids without difficulty. Pt repts LBM 1/29. Pt denies nausea or vomiting and repts passing gas. Pt tolerates diet fair to good. BS+x4. Plan to d/c to home today with supportive wife.

## 2011-05-01 NOTE — Discharge Summary (Signed)
Patient ID: David Gonzalez MRN: [id number] DOB/AGE: July 09, 1970 40 y.o.  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 Complication of anesthesia     reported to be violent waking from anesthesia at age 72  . Mental disorder     mildly claustrophobic  . Hypertension   . Pneumonia     12/13  . Headache     Surgeries: Procedure(s): ANTERIOR CERVICAL DECOMPRESSION/DISCECTOMY FUSION 1 LEVEL on 04/30/2011   Consultants: none  Discharged Condition: Improved  Hospital Course: David Gonzalez is an 41 y.o. male who was admitted 04/30/2011 for operative treatment of cervical spondylotic radiculopathy C5-6. Patient failed conservative treatments (please see the history and physical for the specifics) and had severe unremitting pain that affects sleep, daily activities, and work/hobbies. After pre-op clearance the patient was taken to the operating room on 04/30/2011 and underwent  Procedure(s): ANTERIOR CERVICAL DECOMPRESSION/DISCECTOMY FUSION 1 LEVEL.    Patient was given perioperative antibiotics: Anti-infectives     Start     Dose/Rate Route Frequency Ordered Stop   04/30/11 1400   ceFAZolin (ANCEF) IVPB 1 g/50 mL premix        1 g 100 mL/hr over 30 Minutes Intravenous Every 8 hours 04/30/11 1355 04/30/11 2235   04/29/11 1545   ceFAZolin (ANCEF) IVPB 2 g/50 mL premix        2 g 100 mL/hr over 30 Minutes Intravenous 60 min pre-op 04/29/11 1533 04/30/11 0835           Patient was given sequential compression devices and early ambulation to prevent DVT.   Patient benefited maximally from hospital stay and there were no complications. At the time of discharge, the patient was moving their bowels without difficulty, tolerating a regular diet, pain is controlled with oral pain medications and they  have been cleared by PT/OT.   Recent vital signs: Patient Vitals for the past 24 hrs:  BP Temp Temp src Pulse Resp SpO2 Height Weight  05/01/11 1030 131/77 mmHg - - 89  - - - -  05/01/11 0617 108/72 mmHg 97.7 F (36.5 C) - 77  22  93 % - -  05/01/11 0300 - - - - - - 6\' 9"  (2.057 m) 158.759 kg (350 lb)  04/30/11 2239 138/79 mmHg 98.4 F (36.9 C) Oral 102  18  93 % - -  04/30/11 1340 128/80 mmHg 98.2 F (36.8 C) Oral 94  16  92 % - -  04/30/11 1310 148/68 mmHg 98.2 F (36.8 C) - 100  20  96 % - -  04/30/11 1300 - - - 89  18  94 % - -  04/30/11 1255 143/74 mmHg - - 88  20  94 % - -  04/30/11 1240 139/72 mmHg - - 91  17  95 % - -  04/30/11 1230 - - - 81  16  95 % - -  04/30/11 1225 134/70 mmHg - - 87  15  96 % - -     Recent laboratory studies: No results found for this basename: WBC:2,HGB:2,HCT:2,PLT:2,NA:2,K:2,CL:2,CO2:2,BUN:2,CREATININE:2,GLUCOSE:2,PT:2,INR:2,CALCIUM,2: in the last 72 hours   Discharge Medications:   Medication List  As of 05/01/2011 12:13 PM   STOP taking these medications         traMADol 50 MG tablet         TAKE these medications  lisinopril 10 MG tablet   Commonly known as: PRINIVIL,ZESTRIL   Take 10 mg by mouth daily.      loratadine 10 MG tablet   Commonly known as: CLARITIN   Take 10 mg by mouth daily.      methocarbamol 500 MG tablet   Commonly known as: ROBAXIN   Take 1 tablet (500 mg total) by mouth 3 (three) times daily. Take 1 pill every 8 hours as needed for muscle spasms; MAX 3 pills daily      ondansetron 4 MG tablet   Commonly known as: ZOFRAN   Take 1 tablet (4 mg total) by mouth every 8 (eight) hours as needed for nausea. Take 1 tablet every 8 hours as needed for nausea; MAX 3 pills daily      oxyCODONE 5 MG immediate release tablet   Commonly known as: Oxy IR/ROXICODONE   Take 2 tablets (10 mg total) by mouth every 4 (four) hours as needed for pain.      polyethylene glycol packet   Commonly known as: MIRALAX / GLYCOLAX     Take 17 g by mouth daily. Take 1 packet daily until bowels become regular            Diagnostic Studies: Dg Chest 2 View  04/24/2011  *RADIOLOGY REPORT*  CHEST - 2 VIEW  Clinical Data: Preoperative respiratory exam for cervical ACDF  Comparison: None.  Findings: The cardiac silhouette, mediastinum, pulmonary vasculature are within normal limits.  Both lungs are clear. There is no acute bony abnormality.  IMPRESSION:  There is no evidence of acute cardiac or pulmonary process.  Original Report Authenticated By: Brandon Melnick, M.D.   Dg Cervical Spine 2-3 Views  04/30/2011  *RADIOLOGY REPORT*  Clinical Data: Status post cervical fusion.  CERVICAL SPINE - 2-3 VIEW  Comparison: None.  Findings: AP and cross-table lateral views of the cervical spine demonstrate normal alignment from the skull base through the C7 vertebral bodies.  T1 is not well visualized due to overlying shoulders.  There are vertebral soft tissue contour within normal limits.  No complicating features or fracture identified. postsurgical changes of anterior cervical discectomy and fusion at C5-C6.  IMPRESSION: Immediate postoperative changes of anterior cervical discectomy and fusion at C5-C6.  No complicating features identified.  Original Report Authenticated By: Britta Mccreedy, M.D.   Dg Cervical Spine 2-3 Views  04/30/2011  *RADIOLOGY REPORT*  Clinical Data: C5-6 ACDF.  CERVICAL SPINE - 2-3 VIEW  Comparison: 04/24/2011  Findings: Two intraoperative spot images demonstrate changes of ACDF at C5-6.  Normal alignment.  No hardware or bony complicating feature.  IMPRESSION: C5-6 ACDF.  Original Report Authenticated By: Cyndie Chime, M.D.   Dg Cervical Spine 2-3 Views  04/24/2011  *RADIOLOGY REPORT*  Clinical Data: Preop for labeling; cervical fusion  CERVICAL SPINE - 2-3 VIEW  Comparison: None.  Findings: The AP and lateral cervical alignment are normal.  There is mild cervical spondylosis from C4-C6.  Prevertebral soft tissue  stripe is normal.  IMPRESSION: C4-C6 spondylosis.  Original Report Authenticated By: Brandon Melnick, M.D.    Discharge Orders    Future Orders Please Complete By Expires   Diet - low sodium heart healthy      Call MD / Call 911      Comments:   If you experience chest pain or shortness of breath, CALL 911 and be transported to the hospital emergency room.  If you develope a fever above 101 F, pus (white drainage)  or increased drainage or redness at the wound, or calf pain, call your surgeon's office.   Constipation Prevention      Comments:   Drink plenty of fluids.  Prune juice may be helpful.  You may use a stool softener, such as Colace (over the counter) 100 mg twice a day.  Use MiraLax (over the counter) for constipation as needed.   Increase activity slowly as tolerated      Weight Bearing as taught in Physical Therapy      Comments:   Use a walker or crutches as instructed.   Discharge instructions      Comments:   Keep incision clean and dry.  May shower beginning [facility].  Do not submerge the incision.  Pat dry following shower.  May redress with clean, dry dressing.  Do not put lotion, cream, ointment on the incision.      Follow-up Information    Follow up with Alvy Beal, MD in 2 weeks.   Contact information:   [facility] [address] [place] [address] [phone]          Discharge Plan:  discharge to Home   Disposition: STABLE at time of discharge    Signed: Gwinda Maine 05/01/2011, 12:13 PM

## 2011-05-02 ENCOUNTER — Encounter (HOSPITAL_COMMUNITY): Payer: Self-pay | Admitting: Orthopedic Surgery

## 2011-05-02 NOTE — Progress Notes (Signed)
Utilization review completed. Jessa Stinson, RN, BSN. 05/01/11 

## 2011-09-15 ENCOUNTER — Other Ambulatory Visit: Payer: Self-pay | Admitting: Family Medicine

## 2012-02-21 ENCOUNTER — Ambulatory Visit (INDEPENDENT_AMBULATORY_CARE_PROVIDER_SITE_OTHER): Payer: BC Managed Care – PPO | Admitting: Family Medicine

## 2012-02-21 VITALS — BP 140/85 | HR 71 | Temp 97.7°F | Resp 18 | Wt 364.0 lb

## 2012-02-21 DIAGNOSIS — H6120 Impacted cerumen, unspecified ear: Secondary | ICD-10-CM

## 2012-02-21 DIAGNOSIS — J0100 Acute maxillary sinusitis, unspecified: Secondary | ICD-10-CM

## 2012-02-21 MED ORDER — IPRATROPIUM BROMIDE 0.03 % NA SOLN: 2.0000 | Freq: Four times a day (QID) | NASAL | Status: DC

## 2012-02-21 MED ORDER — AZITHROMYCIN 250 MG PO TABS: ORAL_TABLET | ORAL | Status: DC

## 2012-02-21 NOTE — Progress Notes (Signed)
Subjective:    Patient ID: CAPRICE DRAKOS, male    DOB: 02-25-1971, 41 y.o.   MRN: [id number]  Chief Complaint  Patient presents with  . Sinusitis   HPI  Sev wks of drainage and now w/ severe sinus pressure for the past few days, not sleeping well - has to use nyquil.  Has been taking claritin and sudafed which help temporarily  Did not take lisinopril this a.m.  Past Medical History  Diagnosis Date  . Complication of anesthesia     reported to be violent waking from anesthesia at age 43  . Mental disorder     mildly claustrophobic  . Hypertension   . Pneumonia     12/13  . Headache    Current Outpatient Prescriptions on File Prior to Visit  Medication Sig Dispense Refill  . lisinopril (PRINIVIL,ZESTRIL) 10 MG tablet TAKE 1 TABLET BY MOUTH DAILY  90 tablet  PRN  . loratadine (CLARITIN) 10 MG tablet Take 10 mg by mouth daily.       No current facility-administered medications on file prior to visit.   Allergies  Allergen Reactions  . Hydrocodone Other (See Comments)    Skin turns red     Review of Systems  Constitutional: Positive for diaphoresis and fatigue. Negative for fever, chills, activity change, appetite change and unexpected weight change.  HENT: Positive for congestion, rhinorrhea, voice change, postnasal drip and sinus pressure. Negative for ear pain, sore throat, sneezing, mouth sores, trouble swallowing, neck pain, neck stiffness and dental problem.   Eyes: Positive for discharge and itching.  Respiratory: Positive for cough and chest tightness. Negative for shortness of breath.   Cardiovascular: Negative for chest pain.  Gastrointestinal: Negative for nausea, vomiting, abdominal pain, diarrhea and constipation.  Genitourinary: Positive for frequency. Negative for dysuria.  Musculoskeletal: Negative for myalgias and arthralgias.  Skin: Negative for rash.  Neurological: Positive for dizziness, light-headedness and headaches. Negative for syncope.    Hematological: Negative for adenopathy.  Psychiatric/Behavioral: Positive for sleep disturbance.        BP 140/85  Pulse 71  Temp 97.7 F (36.5 C) (Oral)  Resp 18  Wt 364 lb (165.109 kg) Objective:   Physical Exam  Constitutional: He is oriented to person, place, and time. He appears well-developed and well-nourished. No distress.  HENT:  Head: Normocephalic and atraumatic.  Right Ear: External ear and ear canal normal. Tympanic membrane is retracted. A middle ear effusion is present.  Left Ear: External ear and ear canal normal. Tympanic membrane is retracted. A middle ear effusion is present.  Nose: Mucosal edema and rhinorrhea present. Right sinus exhibits maxillary sinus tenderness. Left sinus exhibits maxillary sinus tenderness.  Mouth/Throat: Uvula is midline and mucous membranes are normal. Posterior oropharyngeal erythema present. No oropharyngeal exudate or posterior oropharyngeal edema.  Eyes: Conjunctivae are normal. Right eye exhibits no discharge. Left eye exhibits no discharge. No scleral icterus.  Neck: Normal range of motion. Neck supple. No thyromegaly present.  Cardiovascular: Normal rate, regular rhythm, normal heart sounds and intact distal pulses.   Pulmonary/Chest: Effort normal and breath sounds normal. No respiratory distress.  Lymphadenopathy:       Head (right side): Submandibular adenopathy present.       Head (left side): Submandibular adenopathy present.    He has no cervical adenopathy.       Right: No supraclavicular adenopathy present.       Left: No supraclavicular adenopathy present.  Neurological: He is alert and oriented to person,  place, and time.  Skin: Skin is warm and dry. He is not diaphoretic. No erythema.  Psychiatric: He has a normal mood and affect. His behavior is normal.      Assessment & Plan:  Sinusitis, acute maxillary - Plan: azithromycin (ZITHROMAX) 250 MG tablet, ipratropium (ATROVENT) 0.03 % nasal spray  Cerumen impaction -  Plan: Ear wax removal, Ear wax removal  Meds ordered this encounter  Medications  . azithromycin (ZITHROMAX) 250 MG tablet    Sig: Take 2 tabs PO x 1 dose, then 1 tab PO QD x 4 days    Dispense:  6 tablet    Refill:  0  . ipratropium (ATROVENT) 0.03 % nasal spray    Sig: Place 2 sprays into the nose 4 (four) times daily.    Dispense:  30 mL    Refill:  2

## 2012-02-21 NOTE — Patient Instructions (Signed)
I strongly recommend that you try a neil med sinus rinse or a netti pot to actually remove the congestion.  Use a nasal saline spray and/or a humidifier to prevent nose bleeds and from becoming to dry.  Also, doing frequent steam exposure (putting head over boiling water and hot showers) should help a lot.  Sinusitis Sinusitis is redness, soreness, and swelling (inflammation) of the paranasal sinuses. Paranasal sinuses are air pockets within the bones of your face (beneath the eyes, the middle of the forehead, or above the eyes). In healthy paranasal sinuses, mucus is able to drain out, and air is able to circulate through them by way of your nose. However, when your paranasal sinuses are inflamed, mucus and air can become trapped. This can allow bacteria and other germs to grow and cause infection. Sinusitis can develop quickly and last only a short time (acute) or continue over a long period (chronic). Sinusitis that lasts for more than 12 weeks is considered chronic.  CAUSES  Causes of sinusitis include:  Allergies.  Structural abnormalities, such as displacement of the cartilage that separates your nostrils (deviated septum), which can decrease the air flow through your nose and sinuses and affect sinus drainage.  Functional abnormalities, such as when the small hairs (cilia) that line your sinuses and help remove mucus do not work properly or are not present. SYMPTOMS  Symptoms of acute and chronic sinusitis are the same. The primary symptoms are pain and pressure around the affected sinuses. Other symptoms include:  Upper toothache.  Earache.  Headache.  Bad breath.  Decreased sense of smell and taste.  A cough, which worsens when you are lying flat.  Fatigue.  Fever.  Thick drainage from your nose, which often is green and may contain pus (purulent).  Swelling and warmth over the affected sinuses. DIAGNOSIS  Your caregiver will perform a physical exam. During the exam, your  caregiver may:  Look in your nose for signs of abnormal growths in your nostrils (nasal polyps).  Tap over the affected sinus to check for signs of infection.  View the inside of your sinuses (endoscopy) with a special imaging device with a light attached (endoscope), which is inserted into your sinuses. If your caregiver suspects that you have chronic sinusitis, one or more of the following tests may be recommended:  Allergy tests.  Nasal culture A sample of mucus is taken from your nose and sent to a lab and screened for bacteria.  Nasal cytology A sample of mucus is taken from your nose and examined by your caregiver to determine if your sinusitis is related to an allergy. TREATMENT  Most cases of acute sinusitis are related to a viral infection and will resolve on their own within 10 days. Sometimes medicines are prescribed to help relieve symptoms (pain medicine, decongestants, nasal steroid sprays, or saline sprays).  However, for sinusitis related to a bacterial infection, your caregiver will prescribe antibiotic medicines. These are medicines that will help kill the bacteria causing the infection.  Rarely, sinusitis is caused by a fungal infection. In theses cases, your caregiver will prescribe antifungal medicine. For some cases of chronic sinusitis, surgery is needed. Generally, these are cases in which sinusitis recurs more than 3 times per year, despite other treatments. HOME CARE INSTRUCTIONS   Drink plenty of water. Water helps thin the mucus so your sinuses can drain more easily.  Use a humidifier.  Inhale steam 3 to 4 times a day (for example, sit in the bathroom  with the shower running).  Apply a warm, moist washcloth to your face 3 to 4 times a day, or as directed by your caregiver.  Use saline nasal sprays to help moisten and clean your sinuses.  Take over-the-counter or prescription medicines for pain, discomfort, or fever only as directed by your caregiver. SEEK  IMMEDIATE MEDICAL CARE IF:  You have increasing pain or severe headaches.  You have nausea, vomiting, or drowsiness.  You have swelling around your face.  You have vision problems.  You have a stiff neck.  You have difficulty breathing. MAKE SURE YOU:   Understand these instructions.  Will watch your condition.  Will get help right away if you are not doing well or get worse. Document Released: 03/17/2005 Document Revised: 06/09/2011 Document Reviewed: 04/01/2011 [facility] Patient Information 2013 [place], [place].

## 2012-10-12 ENCOUNTER — Other Ambulatory Visit: Payer: Self-pay | Admitting: Physician Assistant

## 2012-11-13 ENCOUNTER — Ambulatory Visit (INDEPENDENT_AMBULATORY_CARE_PROVIDER_SITE_OTHER): Payer: BC Managed Care – PPO | Admitting: Physician Assistant

## 2012-11-13 VITALS — BP 136/82 | HR 79 | Temp 98.1°F | Resp 18 | Ht >= 80 in | Wt 366.2 lb

## 2012-11-13 DIAGNOSIS — I10 Essential (primary) hypertension: Secondary | ICD-10-CM

## 2012-11-13 LAB — BASIC METABOLIC PANEL
CO2: 27 mEq/L (ref 19–32)
Calcium: 9.3 mg/dL (ref 8.4–10.5)
Creat: 0.95 mg/dL (ref 0.50–1.35)

## 2012-11-13 MED ORDER — LISINOPRIL 10 MG PO TABS: 10.0000 mg | ORAL_TABLET | Freq: Every day | ORAL | Status: DC

## 2012-11-13 NOTE — Progress Notes (Signed)
  Subjective:    Patient ID: David Gonzalez, male    DOB: 1971-03-19, 42 y.o.   MRN: [id number]  HPI   Mr. Bee is a very pleasant 42 yr old male her for refill on lisinopril.  Has been taking this for about 3-4 yrs now.  Tolerates very well with no adverse effects.  He does not check home BPs, but reports BP is occasionally checked throughout the year (at dentist, etc) and is always normal.  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  He identifies [facility] as his PCP but does not recall his last CPE.  He feels well today and has no complaints.   Review of Systems  Constitutional: Negative.   HENT: Negative.   Respiratory: Negative.   Cardiovascular: Negative.   Gastrointestinal: Negative.   Musculoskeletal: Negative.   Skin: Negative.   Neurological: Negative.        Objective:   Physical Exam  Vitals reviewed. Constitutional: He is oriented to person, place, and time. He appears well-developed and well-nourished. No distress.  HENT:  Head: Normocephalic and atraumatic.  Eyes: Conjunctivae are normal. No scleral icterus.  Cardiovascular: Normal rate, regular rhythm, normal heart sounds and intact distal pulses.   Pulmonary/Chest: Effort normal and breath sounds normal. He has no wheezes. He has no rales.  Abdominal: Soft. There is no tenderness.  Neurological: He is alert and oriented to person, place, and time.  Skin: Skin is warm and dry.  Psychiatric: He has a normal mood and affect. His behavior is normal.        Assessment & Plan:  Hypertension - Plan: Basic metabolic panel, lisinopril (PRINIVIL,ZESTRIL) 10 MG tablet, DISCONTINUED: lisinopril (PRINIVIL,ZESTRIL) 10 MG tablet   [name] is a very pleasant 42 yr old male here for refills of HTN medication.  BP is under good control at current dose.  BMP pending.  I have refilled lisinopril today (30 day supply sent to [facility], printed rx to be sent to mail order).  Encouraged pt to schedule CPE  within the next year for routine health maintenance, screening labs, etc.  Pt understands and is in agreement with this plan.

## 2012-11-13 NOTE — Patient Instructions (Addendum)
Continue taking the lisinopril daily.  I will let you know when your labs are back and if we need to make any changes  Consider scheduling a complete physical for sometime in the next 6-9 months for routine health maintenance, screening labs, etc.   Hypertension As your heart beats, it forces blood through your arteries. This force is your blood pressure. If the pressure is too high, it is called hypertension (HTN) or high blood pressure. HTN is dangerous because you may have it and not know it. High blood pressure may mean that your heart has to work harder to pump blood. Your arteries may be narrow or stiff. The extra work puts you at risk for heart disease, stroke, and other problems.  Blood pressure consists of two numbers, a higher number over a lower, 110/72, for example. It is stated as "110 over 72." The ideal is below 120 for the top number (systolic) and under 80 for the bottom (diastolic). Write down your blood pressure today. You should pay close attention to your blood pressure if you have certain conditions such as:  Heart failure.  Prior heart attack.  Diabetes  Chronic kidney disease.  Prior stroke.  Multiple risk factors for heart disease. To see if you have HTN, your blood pressure should be measured while you are seated with your arm held at the level of the heart. It should be measured at least twice. A one-time elevated blood pressure reading (especially in the Emergency Department) does not mean that you need treatment. There may be conditions in which the blood pressure is different between your right and left arms. It is important to see your caregiver soon for a recheck. Most people have essential hypertension which means that there is not a specific cause. This type of high blood pressure may be lowered by changing lifestyle factors such as:  Stress.  Smoking.  Lack of exercise.  Excessive weight.  Drug/tobacco/alcohol use.  Eating less salt. Most people  do not have symptoms from high blood pressure until it has caused damage to the body. Effective treatment can often prevent, delay or reduce that damage. TREATMENT  When a cause has been identified, treatment for high blood pressure is directed at the cause. There are a large number of medications to treat HTN. These fall into several categories, and your caregiver will help you select the medicines that are best for you. Medications may have side effects. You should review side effects with your caregiver. If your blood pressure stays high after you have made lifestyle changes or started on medicines,   Your medication(s) may need to be changed.  Other problems may need to be addressed.  Be certain you understand your prescriptions, and know how and when to take your medicine.  Be sure to follow up with your caregiver within the time frame advised (usually within two weeks) to have your blood pressure rechecked and to review your medications.  If you are taking more than one medicine to lower your blood pressure, make sure you know how and at what times they should be taken. Taking two medicines at the same time can result in blood pressure that is too low. SEEK IMMEDIATE MEDICAL CARE IF:  You develop a severe headache, blurred or changing vision, or confusion.  You have unusual weakness or numbness, or a faint feeling.  You have severe chest or abdominal pain, vomiting, or breathing problems. MAKE SURE YOU:   Understand these instructions.  Will watch your condition.  Will get help right away if you are not doing well or get worse. Document Released: 03/17/2005 Document Revised: 06/09/2011 Document Reviewed: 11/05/2007 [facility] Patient Information 2014 [place], [place].

## 2013-03-09 IMAGING — CR DG CERVICAL SPINE 2 OR 3 VIEWS
2 series · 2 of 2 positions shown · non-contrast
Comparison: None.

CLINICAL DATA: Preop for labeling; cervical fusion

CERVICAL SPINE - 2-3 VIEW

[view not recorded (1 of 2)]
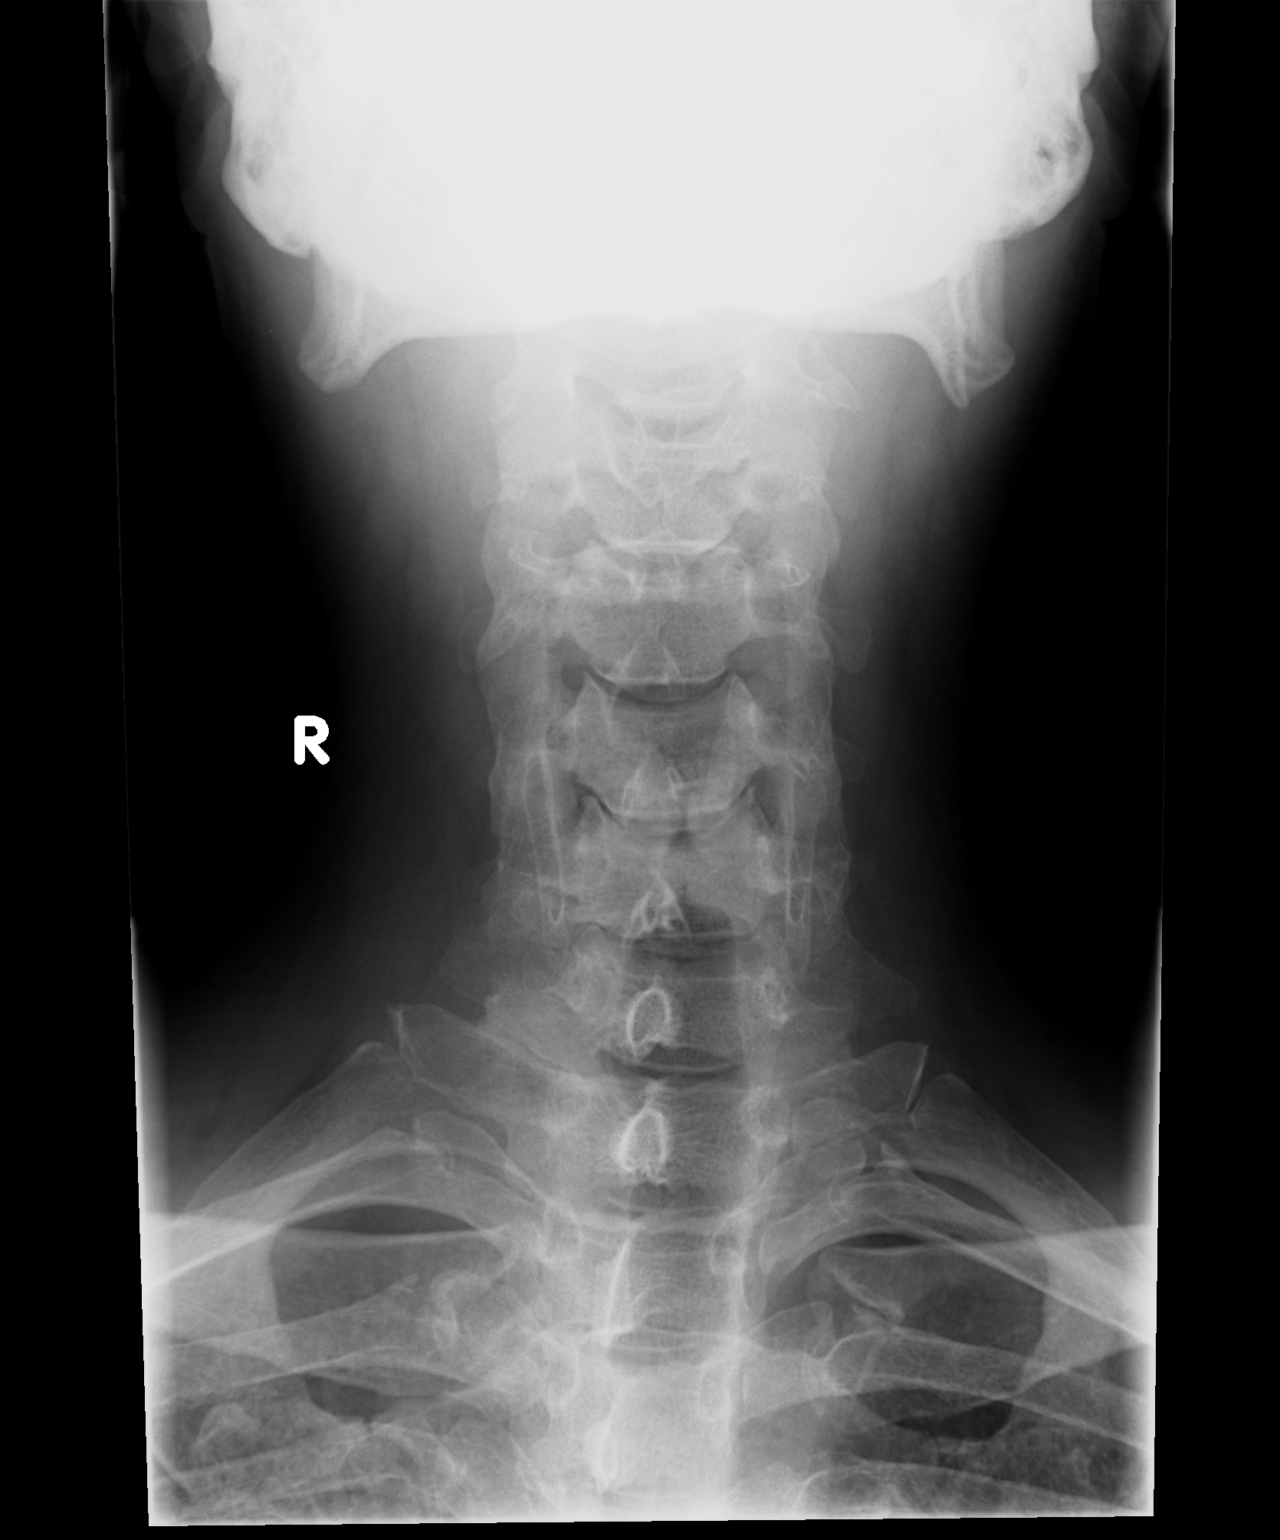

[view not recorded (2 of 2)]
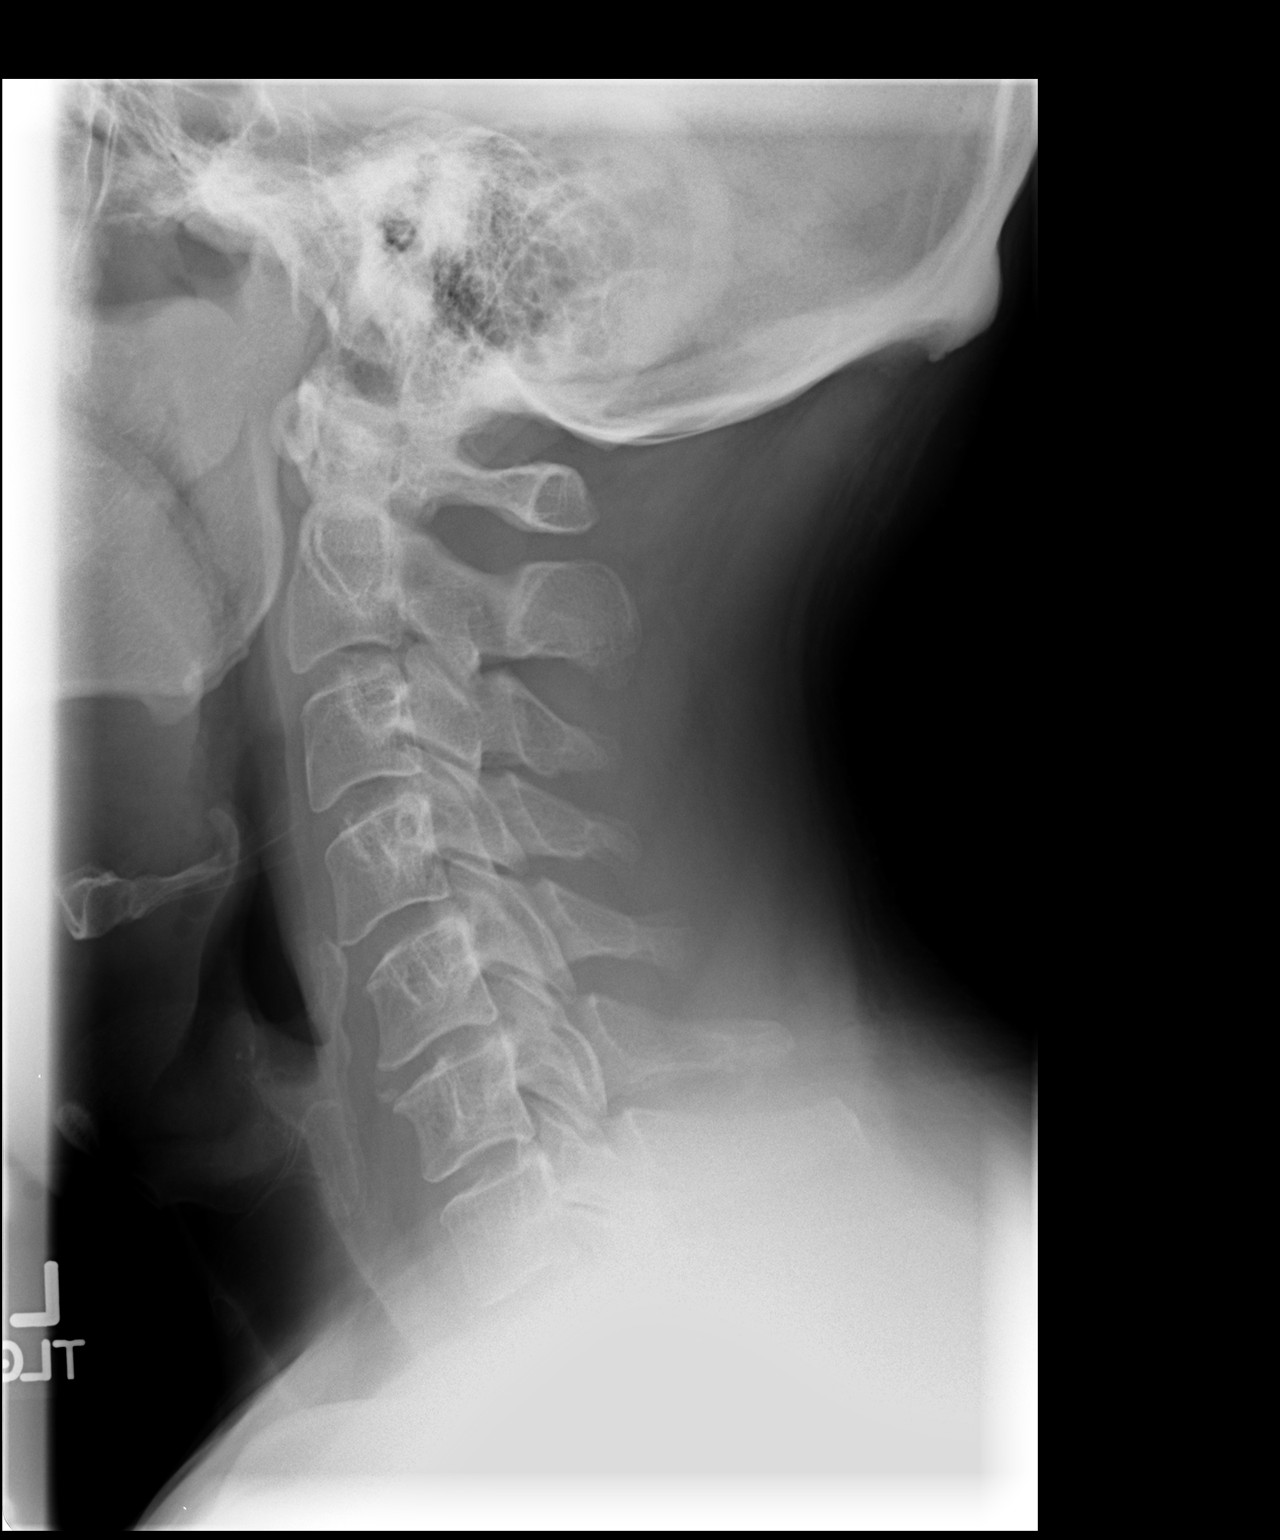

[2 of 2 positions shown; findings below may reference images not displayed]

FINDINGS: The AP and lateral cervical alignment are normal.  There
is mild cervical spondylosis from C4-C6.  Prevertebral soft tissue
stripe is normal.
IMPRESSION: C4-C6 spondylosis.

## 2013-03-09 IMAGING — CR DG CHEST 2V
3 series · 3 of 3 positions shown · non-contrast
Comparison: None.

CHEST - 2 VIEW
CLINICAL DATA: Preoperative respiratory exam for cervical ACDF

[view not recorded (1 of 3)]
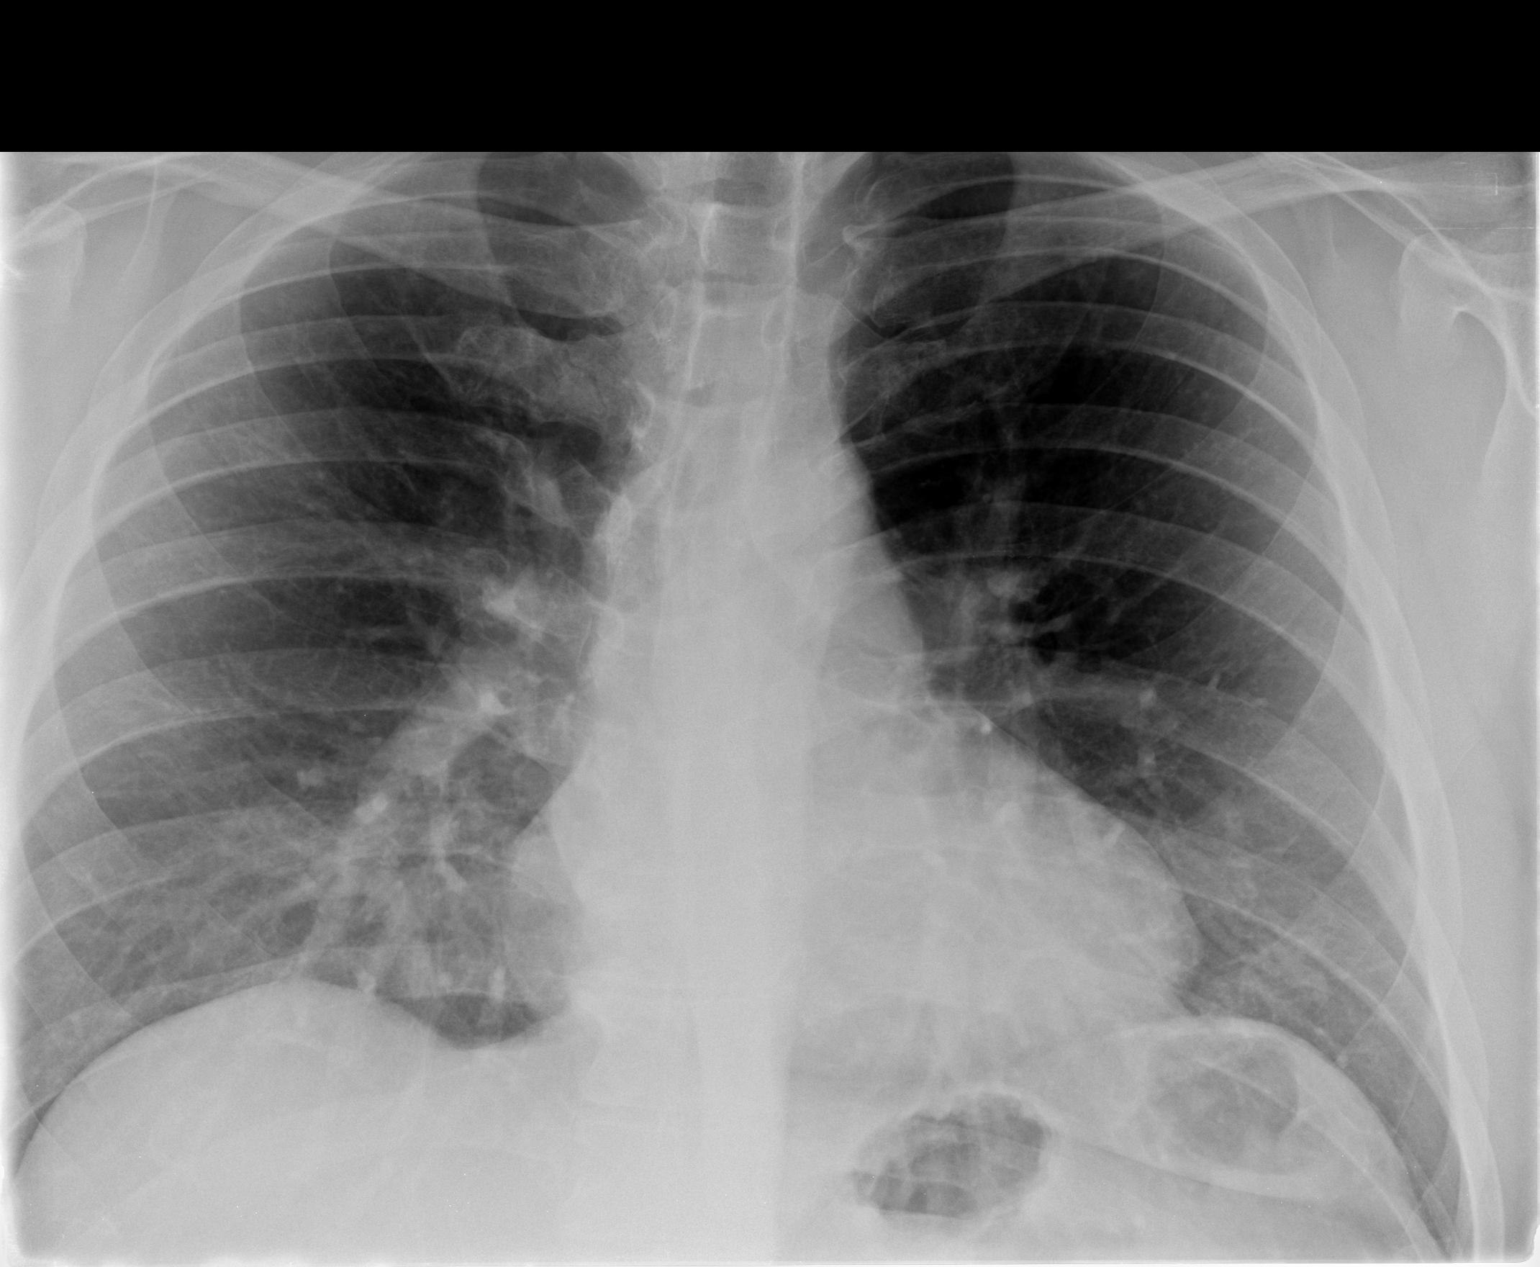

[view not recorded (2 of 3)]
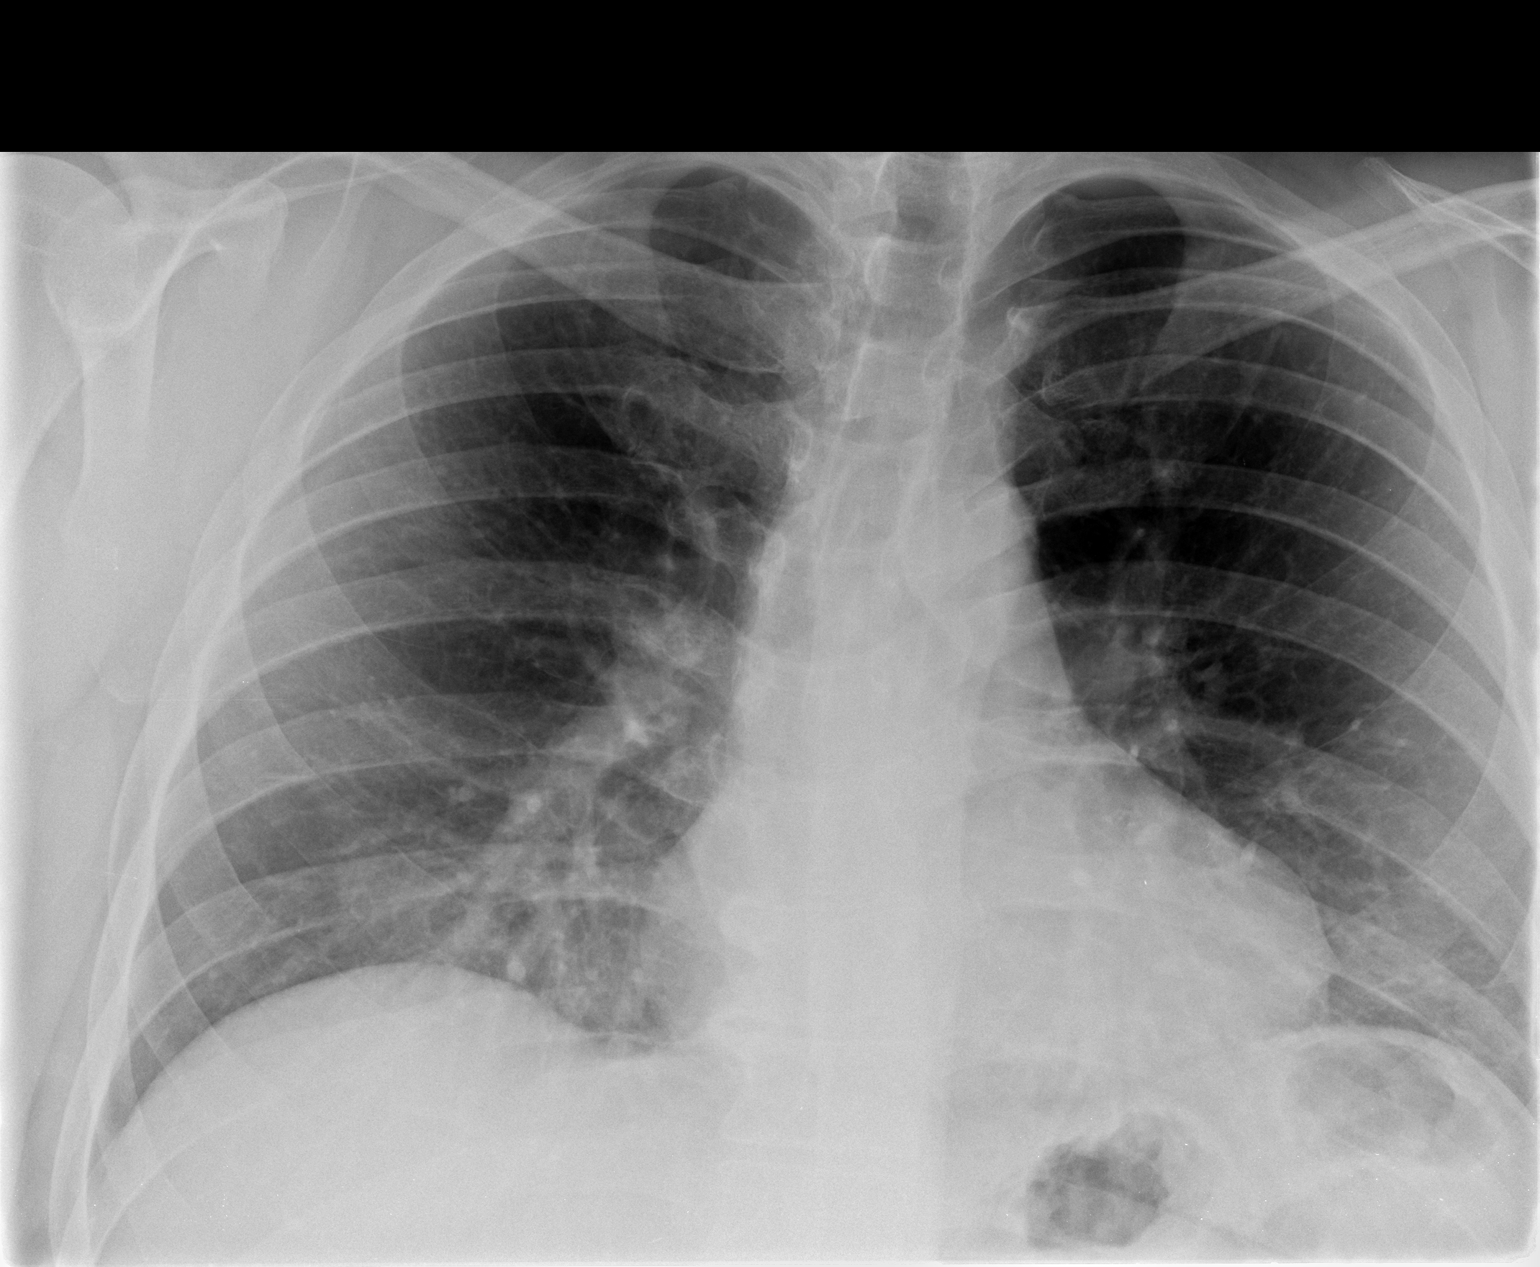

[view not recorded (3 of 3)]
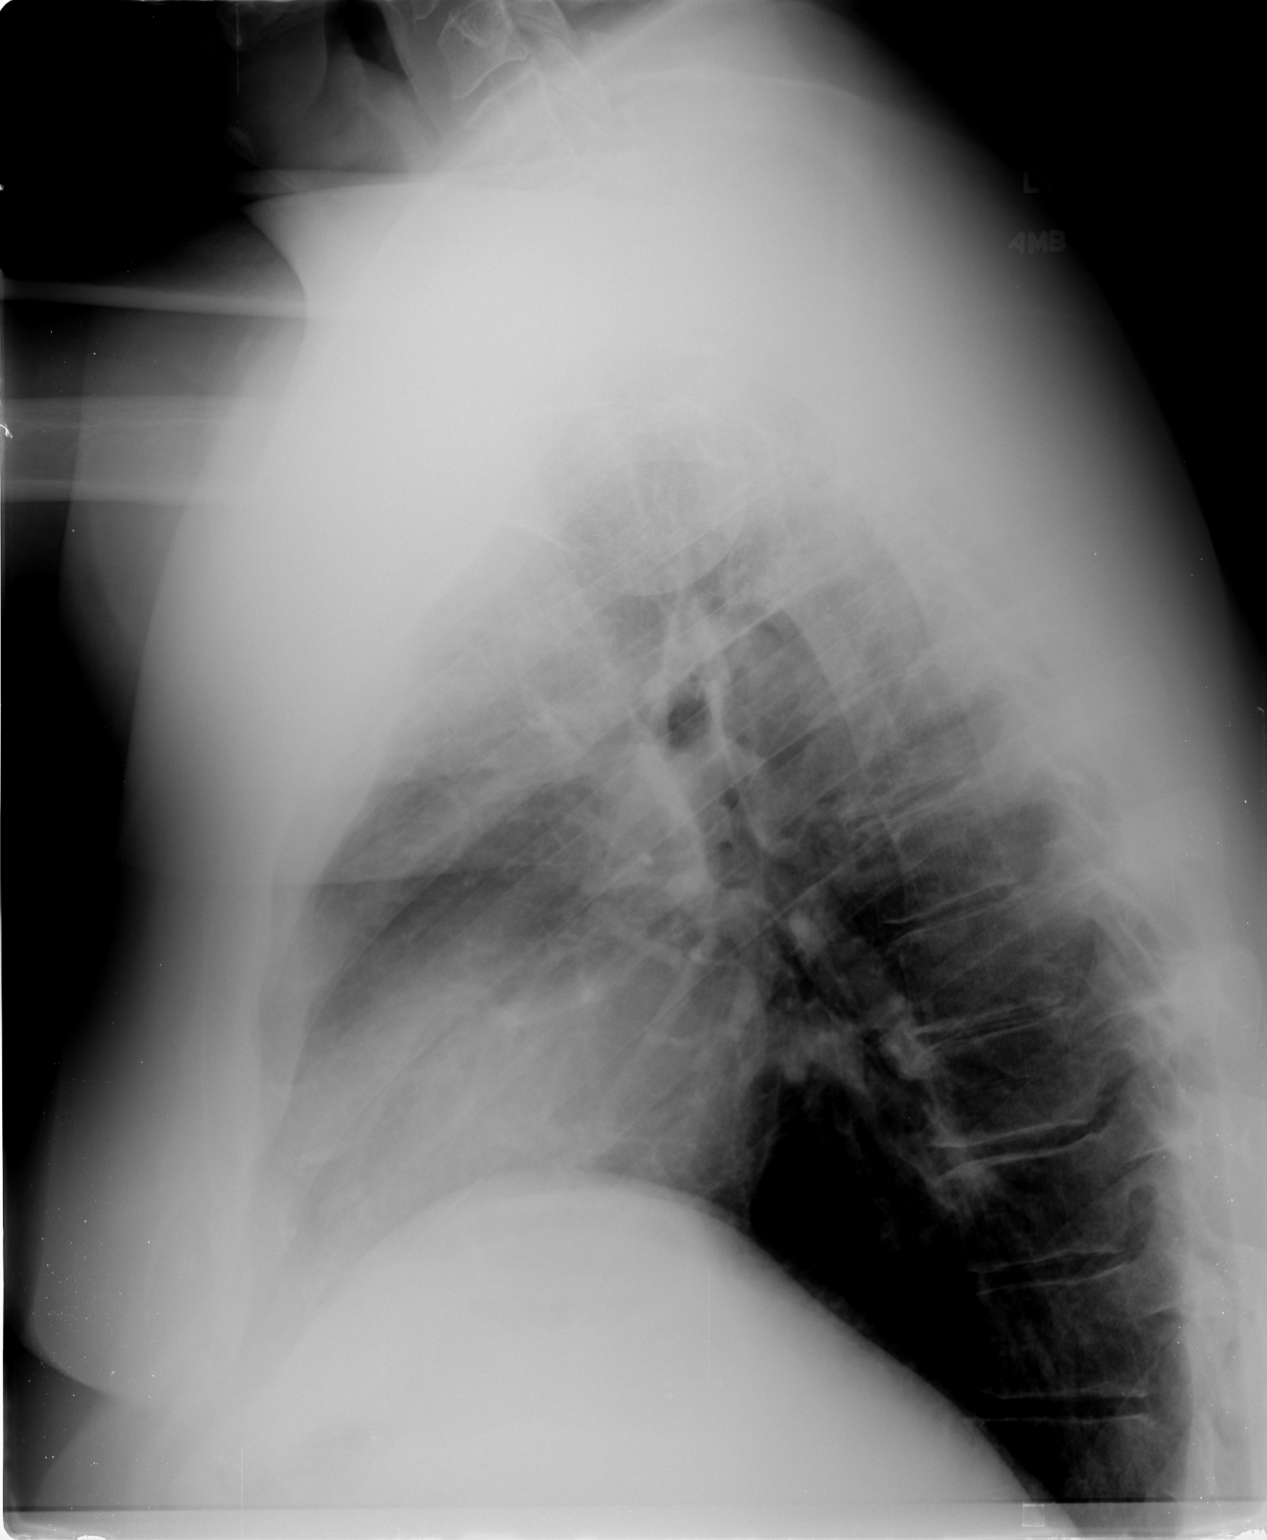

[3 of 3 positions shown; findings below may reference images not displayed]

FINDINGS: The cardiac silhouette, mediastinum, pulmonary
vasculature are within normal limits.  Both lungs are clear.
There is no acute bony abnormality.
IMPRESSION: There is no evidence of acute cardiac or pulmonary process.]

## 2013-03-27 ENCOUNTER — Ambulatory Visit (INDEPENDENT_AMBULATORY_CARE_PROVIDER_SITE_OTHER): Payer: BC Managed Care – PPO | Admitting: Family Medicine

## 2013-03-27 VITALS — BP 134/90 | HR 108 | Temp 98.7°F | Resp 16 | Ht 79.75 in | Wt 361.4 lb

## 2013-03-27 DIAGNOSIS — J069 Acute upper respiratory infection, unspecified: Secondary | ICD-10-CM

## 2013-03-27 DIAGNOSIS — J029 Acute pharyngitis, unspecified: Secondary | ICD-10-CM

## 2013-03-27 LAB — POCT INFLUENZA A/B: Influenza B, POC: NEGATIVE

## 2013-03-27 LAB — POCT RAPID STREP A (OFFICE): Rapid Strep A Screen: NEGATIVE

## 2013-03-27 MED ORDER — IPRATROPIUM BROMIDE 0.03 % NA SOLN: 2.0000 | Freq: Four times a day (QID) | NASAL | Status: DC

## 2013-03-27 MED ORDER — MAGIC MOUTHWASH W/LIDOCAINE: 10.0000 mL | ORAL | Status: DC | PRN

## 2013-03-27 MED ORDER — GUAIFENESIN-CODEINE 100-10 MG/5ML PO SOLN: 10.0000 mL | Freq: Four times a day (QID) | ORAL | Status: DC | PRN

## 2013-03-27 MED ORDER — AZITHROMYCIN 250 MG PO TABS: ORAL_TABLET | ORAL | Status: DC

## 2013-03-27 NOTE — Progress Notes (Signed)
Subjective:  This chart was scribed for David Sorenson, MD by Carl Best, Medical Scribe. This patient was seen in Room 3 and the patient's care was started at 8:29 AM.   Patient ID: David Gonzalez, male    DOB: 24-Jul-1970, 42 y.o.   MRN: [id number] Chief Complaint  Patient presents with  . Sore Throat    X Friday  . Chest Congestion    X Saturday    HPI HPI Comments: David Gonzalez is a 42 y.o. male who presents to the [facility] complaining of constant sore throat, cough productive of milky green sputum, and chest congestion that started two days ago.  He states that his sore throat started worsening yesterday.  He lists mild, SOB, fever, chills, diaphoresis, decreased appetite, urinary frequency, post tussle emesis, postnasal drip and bilateral otalgia as associated symptoms.  He states that yesterday his fever was 101 degrees.  He denies dental pain, nausea, myalgias, and sinus pressure as associated symptoms.  He states that he has been using an OTC decongestant for his symptoms with no relief.  He states that both of his children have been sick.      Past Medical History  Diagnosis Date  . Complication of anesthesia     reported to be violent waking from anesthesia at age 48  . Mental disorder     mildly claustrophobic  . Hypertension   . Pneumonia     12/13  . ZHYQMVHQ(469.6)    Past Surgical History  Procedure Laterality Date  . Knee arthroscopy    . Tonsillectomy    . Anterior cervical decomp/discectomy fusion  04/30/2011    Procedure: ANTERIOR CERVICAL DECOMPRESSION/DISCECTOMY FUSION 1 LEVEL;  Surgeon: Alvy Beal, MD;  Location: [facility] OR;  Service: Orthopedics;  Laterality: N/A;  ACDF C5-6   History reviewed. No pertinent family history. History   Social History  . Marital Status: Married    Spouse Name: N/A    Number of Children: N/A  . Years of Education: N/A   Occupational History  . Not on file.   Social History Main Topics  .  Smoking status: Never Smoker   . Smokeless tobacco: Not on file  . Alcohol Use: No  . Drug Use: No  . Sexual Activity:    Other Topics Concern  . Not on file   Social History Narrative  . No narrative on file   Allergies  Allergen Reactions  . Hydrocodone Other (See Comments)    Skin turns red     Review of Systems  Constitutional: Positive for fever, chills and diaphoresis. Appetite change: decreased.  HENT: Positive for congestion, ear pain, postnasal drip and rhinorrhea. Negative for dental problem and sinus pressure.   Respiratory: Positive for cough and shortness of breath (mild).   Gastrointestinal: Positive for vomiting (post tussle). Negative for nausea.  Genitourinary: Positive for frequency.  Musculoskeletal: Negative for myalgias.  All other systems reviewed and are negative.     BP 134/90  Pulse 108  Temp(Src) 98.7 F (37.1 C) (Oral)  Resp 16  Ht 6' 7.75" (2.026 m)  Wt 361 lb 6.4 oz (163.93 kg)  BMI 39.94 kg/m2  SpO2 97% Objective:  Physical Exam  Nursing note and vitals reviewed. Constitutional: He appears well-developed and well-nourished. No distress.  HENT:  Head: Normocephalic and atraumatic.  Right Ear: External ear normal. Tympanic membrane is not retracted and not bulging. A middle ear effusion is present.  Left Ear: External  ear normal. Tympanic membrane is not retracted and not bulging. A middle ear effusion is present.  Nose: Mucosal edema and rhinorrhea present.  Mouth/Throat: Uvula is midline and oropharynx is clear and moist. No oropharyngeal exudate.  Neck: Normal range of motion. Neck supple. No thyromegaly present.  Cardiovascular: Regular rhythm and normal heart sounds.  Tachycardia present.   Pulmonary/Chest: Effort normal and breath sounds normal. No respiratory distress. He has no wheezes. He has no rales.  Musculoskeletal: Normal range of motion.  Lymphadenopathy:    He has no cervical adenopathy.  Skin: Skin is warm and dry.    Psychiatric: He has a normal mood and affect. His behavior is normal.    Results for orders placed in visit on 03/27/13  POCT RAPID STREP A (OFFICE)      Result Value Range   Rapid Strep A Screen Negative  Negative  POCT INFLUENZA A/B      Result Value Range   Influenza A, POC Negative     Influenza B, POC Negative       Assessment & Plan:   URI, acute - Plan: POCT rapid strep A, POCT Influenza A/B, Culture, Group A Strep  Acute pharyngitis - Plan: Culture, Group A Strep - Advised likely viral infection.  Advised symptomatic care with hopeful improvement in the next 24-48 hours. If fevers, chills, localized sinus pressure, and purulent cough advised the patient to fill the prescription for the Z-Pak.   Meds ordered this encounter  Medications  . azithromycin (ZITHROMAX) 250 MG tablet    Sig: Take 2 tabs PO x 1 dose, then 1 tab PO QD x 4 days    Dispense:  6 tablet    Refill:  0  . guaiFENesin-codeine 100-10 MG/5ML syrup    Sig: Take 10 mLs by mouth every 6 (six) hours as needed for cough.    Dispense:  240 mL    Refill:  0  . ipratropium (ATROVENT) 0.03 % nasal spray    Sig: Place 2 sprays into the nose 4 (four) times daily.    Dispense:  30 mL    Refill:  1  . Alum & Mag Hydroxide-Simeth (MAGIC MOUTHWASH W/LIDOCAINE) SOLN    Sig: Take 10 mLs by mouth every 2 (two) hours as needed for mouth pain. Ok to use your stock formulary. Mix 2:1 ratio w/ viscous lidocaine    Dispense:  360 mL    Refill:  0    I personally performed the services described in this documentation, which was scribed in my presence. The recorded information has been reviewed and considered, and addended by me as needed.  David Sorenson, MD MPH

## 2013-06-26 ENCOUNTER — Other Ambulatory Visit: Payer: Self-pay | Admitting: Physician Assistant

## 2013-08-29 ENCOUNTER — Ambulatory Visit (INDEPENDENT_AMBULATORY_CARE_PROVIDER_SITE_OTHER): Payer: BC Managed Care – PPO | Admitting: Physician Assistant

## 2013-08-29 VITALS — BP 140/94 | HR 68 | Temp 98.6°F | Resp 16 | Ht 79.0 in | Wt 375.0 lb

## 2013-08-29 DIAGNOSIS — I10 Essential (primary) hypertension: Secondary | ICD-10-CM

## 2013-08-29 LAB — BASIC METABOLIC PANEL
BUN: 19 mg/dL (ref 6–23)
CHLORIDE: 107 meq/L (ref 96–112)
CO2: 26 meq/L (ref 19–32)
CREATININE: 0.86 mg/dL (ref 0.50–1.35)
Calcium: 9.5 mg/dL (ref 8.4–10.5)
Glucose, Bld: 97 mg/dL (ref 70–99)
Potassium: 4.2 mEq/L (ref 3.5–5.3)
Sodium: 138 mEq/L (ref 135–145)

## 2013-08-29 MED ORDER — LISINOPRIL 10 MG PO TABS: 10.0000 mg | ORAL_TABLET | Freq: Every day | ORAL | Status: DC

## 2013-08-29 NOTE — Progress Notes (Signed)
   Subjective:    Patient ID: David Gonzalez, male    DOB: [birth year], 43 y.o.   MRN: [id number]  HPI   David Gonzalez is a very pleasant 43 yr old male here for refill of lisinopril.  Has been on for several years now.  Tolerates well.  Has been out for about 1 wk, consequently BP elevated today to 140/94.  He does not regularly check BP at home - every now and then will check at the pharmacy, typically systolics 782N.   Reports that BP is usually only high when he is at the doctor, esp for DOT exams.  He denies CP, SOB.  He does have some occ LE edema, but this occurs after a day on his feet - resolves with rest.  He has recently started exercising more regularly - rides bike x 25 minutes a couple times per week.  He admits there is room for improvement on his diet   Review of Systems  Constitutional: Negative.   Respiratory: Negative for chest tightness and shortness of breath.   Cardiovascular: Positive for leg swelling (occasional). Negative for chest pain and palpitations.  Gastrointestinal: Negative.   Musculoskeletal: Negative.   Skin: Negative.        Objective:   Physical Exam  Vitals reviewed. Constitutional: He is oriented to person, place, and time. He appears well-developed and well-nourished. No distress.  HENT:  Head: Normocephalic and atraumatic.  Eyes: Conjunctivae are normal. No scleral icterus.  Cardiovascular: Normal rate, regular rhythm and normal heart sounds.   Pulmonary/Chest: Effort normal and breath sounds normal. He has no wheezes. He has no rales.  Musculoskeletal: He exhibits no edema.  Neurological: He is alert and oriented to person, place, and time.  Skin: Skin is warm and dry.  Psychiatric: He has a normal mood and affect. His behavior is normal.       Assessment & Plan:  Hypertension - Plan: Basic metabolic panel, lisinopril (PRINIVIL,ZESTRIL) 10 MG tablet   David Gonzalez is a very pleasant 43 yr old male here for refill of lisinopril.  HTN well  controlled.  Continue current regimen.  Discussed importance of maintaining BP control for DOT compliance.  Discussed lifestyle modification to improve BP.  Encourage him to schedule CPE/wellness visit (outside of DOT exams.)  BMP pending.    Pt to call or RTC if worsening or not improving  E. Natividad Brood MHS, PA-C [facility] 6/1/201512:50 PM

## 2013-08-29 NOTE — Patient Instructions (Signed)
Continue taking lisinopril daily.  Check your blood pressures periodically - if consistently seeing numbers >140/>90 we may need to adjust your medication  Keep up the good work with exercising!  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 These other health benefits include reducing the risk of breast cancer after menopause and reducing the risk of type 2 diabetes, heart disease, colon cancer, and stroke. Health benefits also include weight loss and slowing kidney failure in patients with chronic kidney disease.  DIET GUIDELINES  Limit salt (sodium). Your diet should contain less than 1500 mg of sodium daily.  Limit refined or processed carbohydrates. Your diet should include mostly whole grains. Desserts and added sugars should be used sparingly.  Include small amounts of heart-healthy fats. These types of fats include nuts, oils, and tub margarine. Limit saturated and trans fats. These fats have been shown to be harmful in the body. CHOOSING FOODS  The following food groups are based on a 2000 calorie diet. See your Registered Dietitian for individual calorie needs. Grains and Grain Products (6 to 8 servings daily)  Eat More Often: Whole-wheat bread, brown rice, whole-grain or wheat pasta, quinoa, popcorn without added fat or salt (air popped).  Eat Less Often: White bread, white pasta, white rice,  cornbread. Vegetables (4 to 5 servings daily)  Eat More Often: Fresh, frozen, and canned vegetables. Vegetables may be raw, steamed, roasted, or grilled with a minimal amount of fat.  Eat Less Often/Avoid: Creamed or fried vegetables. Vegetables in a cheese sauce. Fruit (4 to 5 servings daily)  Eat More Often: All fresh, canned (in natural juice), or frozen fruits. Dried fruits without added sugar. One hundred percent fruit juice ( cup [237 mL] daily).  Eat Less Often: Dried fruits with added sugar. Canned fruit in light or heavy syrup. [employer], Fish, and Poultry (2 servings or less daily. One serving is 3 to 4 oz [85-114 g]).  Eat More Often: Ninety percent or leaner ground beef, tenderloin, sirloin. Round cuts of beef, chicken breast, [place] breast. All fish. Grill, bake, or broil your meat. Nothing should be fried.  Eat Less Often/Avoid: Fatty cuts of meat, [place], or chicken leg, thigh, or wing. Fried cuts of meat or fish. Dairy (2 to 3 servings)  Eat More Often: Low-fat or fat-free milk, low-fat plain or light yogurt, reduced-fat or part-skim cheese.  Eat Less Often/Avoid: Milk (whole, 2%).Whole milk yogurt. Full-fat cheeses. Nuts, Seeds, and Legumes (4 to 5 servings per week)  Eat More Often: All without added salt.  Eat Less Often/Avoid: Salted nuts and seeds, canned beans with added salt. Fats and Sweets (limited)  Eat More Often: Vegetable oils, tub margarines without trans fats, sugar-free gelatin. Mayonnaise and salad dressings.  Eat Less Often/Avoid: Coconut oils, palm oils, butter, stick margarine, cream, half and half, cookies, candy, pie. FOR MORE INFORMATION The Dash Diet Eating Plan: www.dashdiet.org Document Released: 03/06/2011  Document Revised: 06/09/2011 Document Reviewed: 03/06/2011 [facility] Patient Information 2014 [place].

## 2014-03-08 ENCOUNTER — Ambulatory Visit (INDEPENDENT_AMBULATORY_CARE_PROVIDER_SITE_OTHER): Payer: BC Managed Care – PPO | Admitting: Family Medicine

## 2014-03-08 VITALS — BP 144/90 | HR 82 | Temp 98.1°F | Resp 18 | Ht >= 80 in | Wt 384.0 lb

## 2014-03-08 DIAGNOSIS — J0101 Acute recurrent maxillary sinusitis: Secondary | ICD-10-CM

## 2014-03-08 DIAGNOSIS — R490 Dysphonia: Secondary | ICD-10-CM

## 2014-03-08 DIAGNOSIS — R0789 Other chest pain: Secondary | ICD-10-CM

## 2014-03-08 DIAGNOSIS — I10 Essential (primary) hypertension: Secondary | ICD-10-CM

## 2014-03-08 MED ORDER — LISINOPRIL 10 MG PO TABS: 10.0000 mg | ORAL_TABLET | Freq: Every day | ORAL | Status: DC

## 2014-03-08 MED ORDER — AMOXICILLIN 875 MG PO TABS: 875.0000 mg | ORAL_TABLET | Freq: Two times a day (BID) | ORAL | Status: DC

## 2014-03-08 MED ORDER — PREDNISONE 20 MG PO TABS: 40.0000 mg | ORAL_TABLET | Freq: Every day | ORAL | Status: DC

## 2014-03-08 NOTE — Progress Notes (Signed)
Patient ID: David Gonzalez MRN: [id number], DOB: [birth year], 43 y.o. Date of Encounter: 03/08/2014, 11:07 AM  Primary Physician: PROVIDER NOT IN SYSTEM  Chief Complaint:  Chief Complaint  Patient presents with  . Nasal Congestion    x2 weeks   . 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 year old male presents with 21 day history of nasal congestion, post nasal drip, sore throat, sinus pressure, and cough. Afebrile. No chills. Nasal congestion thick and green/yellow. Sinus pressure is the worst symptom. Cough is productive secondary to post nasal drip and not associated with time of day. Ears feel full, leading to sensation of muffled hearing. Has tried OTC cold preps without success. No GI complaints.   No recent antibiotics, recent travels, vomiting, or sick contacts   No leg trauma, sedentary periods, h/o cancer, or tobacco use.  Past Medical History  Diagnosis Date  . Complication of anesthesia     reported to be violent waking from anesthesia at age 43  . Mental disorder     mildly claustrophobic  . Hypertension   . Pneumonia     12/13  . Headache(784.0)      Home Meds: Prior to Admission medications   Medication Sig Start Date End Date Taking? Authorizing Provider  Glucosamine-Chondroitin (GLUCOSAMINE CHONDR COMPLEX PO) Take by mouth.   Yes Historical Provider, MD  lisinopril (PRINIVIL,ZESTRIL) 10 MG tablet Take 1 tablet (10 mg total) by mouth daily. 03/08/14  Yes Robyn Haber, MD  loratadine (CLARITIN) 10 MG tablet Take 10 mg by mouth daily.   Yes Historical Provider, MD  amoxicillin (AMOXIL) 875 MG tablet Take 1 tablet (875 mg total) by mouth 2 (two) times daily. 03/08/14   Robyn Haber, MD  predniSONE (DELTASONE) 20 MG tablet Take 2 tablets (40 mg total) by mouth daily. 03/08/14   Robyn Haber, MD    Allergies:  Allergies  Allergen Reactions  . Hydrocodone Other (See Comments)    Skin turns red     History   Social History  . Marital Status: Married    Spouse Name: N/A    Number of Children: N/A  . Years of Education: N/A   Occupational History  . Not on file.   Social History Main Topics  . Smoking status: Never Smoker   . Smokeless tobacco: Not on file  . Alcohol Use: No  . Drug Use: No  . Sexual Activity: Not on file   Other Topics Concern  . Not on file   Social History Narrative     Review of Systems: Constitutional: negative for chills, fever, night sweats or weight changes Cardiovascular: negative for chest pain or palpitations Respiratory: negative for hemoptysis, wheezing, or shortness of breath Abdominal: negative for abdominal pain, nausea, vomiting or diarrhea Dermatological: negative for rash Neurologic: negative for headache   Physical Exam: Blood pressure 144/90, pulse 82, temperature 98.1 F (36.7 C), temperature source Oral, resp. rate 18, height 6\' 9"  (2.057 m), weight 384 lb (174.181 kg), SpO2 99 %., Body mass index is 41.17 kg/(m^2). General: Well developed, well nourished, in no acute distress. Head: Normocephalic, atraumatic, eyes without discharge, sclera non-icteric, nares are congested. Bilateral auditory canals clear, TM's are without perforation, pearly grey with reflective cone of light bilaterally. Serous effusion bilaterally behind TM's. Maxillary sinus TTP. Oral cavity moist, dentition normal. Posterior pharynx with post nasal drip and mild erythema. No peritonsillar abscess or tonsillar exudate.  Neck: Supple. No thyromegaly. Full ROM. No lymphadenopathy. Lungs: Clear bilaterally to auscultation without wheezes, rales, or rhonchi. Breathing is unlabored.  Heart: RRR with S1 S2. No murmurs, rubs, or gallops appreciated. Msk:  Strength and tone normal for age. Extremities: No clubbing or cyanosis. No edema. Neuro: Alert and oriented X 3. Moves all extremities spontaneously. CNII-XII grossly in tact. Psych:  Responds to questions  appropriately with a normal affect.    ASSESSMENT AND PLAN:  43 y.o. year old male with sinusitis - Recurrent maxillary sinusitis, unspecified chronicity - Plan: amoxicillin (AMOXIL) 875 MG tablet, predniSONE (DELTASONE) 20 MG tablet  Essential hypertension - Plan: lisinopril (PRINIVIL,ZESTRIL) 10 MG tablet  Hoarseness - Plan: amoxicillin (AMOXIL) 875 MG tablet, predniSONE (DELTASONE) 20 MG tablet  Chest heaviness - Plan: amoxicillin (AMOXIL) 875 MG tablet, predniSONE (DELTASONE) 20 MG tablet   -Tylenol/Motrin prn -Rest/fluids -RTC precautions -RTC 3-5 days if no improvement  Signed, Robyn Haber, MD 03/08/2014 11:07 AM

## 2014-03-08 NOTE — Patient Instructions (Signed)
You may use afrin nasal spray twice a day for 3 days.   Sinusitis Sinusitis is redness, soreness, and inflammation of the paranasal sinuses. Paranasal sinuses are air pockets within the bones of your face (beneath the eyes, the middle of the forehead, or above the eyes). In healthy paranasal sinuses, mucus is able to drain out, and air is able to circulate through them by way of your nose. However, when your paranasal sinuses are inflamed, mucus and air can become trapped. This can allow bacteria and other germs to grow and cause infection. Sinusitis can develop quickly and last only a short time (acute) or continue over a long period (chronic). Sinusitis that lasts for more than 12 weeks is considered chronic.  CAUSES  Causes of sinusitis include:  Allergies.  Structural abnormalities, such as displacement of the cartilage that separates your nostrils (deviated septum), which can decrease the air flow through your nose and sinuses and affect sinus drainage.  Functional abnormalities, such as when the small hairs (cilia) that line your sinuses and help remove mucus do not work properly or are not present. SIGNS AND SYMPTOMS  Symptoms of acute and chronic sinusitis are the same. The primary symptoms are pain and pressure around the affected sinuses. Other symptoms include:  Upper toothache.  Earache.  Headache.  Bad breath.  Decreased sense of smell and taste.  A cough, which worsens when you are lying flat.  Fatigue.  Fever.  Thick drainage from your nose, which often is green and may contain pus (purulent).  Swelling and warmth over the affected sinuses. DIAGNOSIS  Your health care provider will perform a physical exam. During the exam, your health care provider may:  Look in your nose for signs of abnormal growths in your nostrils (nasal polyps).  Tap over the affected sinus to check for signs of infection.  View the inside of your sinuses (endoscopy) using an imaging  device that has a light attached (endoscope). If your health care provider suspects that you have chronic sinusitis, one or more of the following tests may be recommended:  Allergy tests.  Nasal culture. A sample of mucus is taken from your nose, sent to a lab, and screened for bacteria.  Nasal cytology. A sample of mucus is taken from your nose and examined by your health care provider to determine if your sinusitis is related to an allergy. TREATMENT  Most cases of acute sinusitis are related to a viral infection and will resolve on their own within 10 days. Sometimes medicines are prescribed to help relieve symptoms (pain medicine, decongestants, nasal steroid sprays, or saline sprays).  However, for sinusitis related to a bacterial infection, your health care provider will prescribe antibiotic medicines. These are medicines that will help kill the bacteria causing the infection.  Rarely, sinusitis is caused by a fungal infection. In theses cases, your health care provider will prescribe antifungal medicine. For some cases of chronic sinusitis, surgery is needed. Generally, these are cases in which sinusitis recurs more than 3 times per year, despite other treatments. HOME CARE INSTRUCTIONS   Drink plenty of water. Water helps thin the mucus so your sinuses can drain more easily.  Use a humidifier.  Inhale steam 3 to 4 times a day (for example, sit in the bathroom with the shower running).  Apply a warm, moist washcloth to your face 3 to 4 times a day, or as directed by your health care provider.  Use saline nasal sprays to help moisten and clean  your sinuses.  Take medicines only as directed by your health care provider.  If you were prescribed either an antibiotic or antifungal medicine, finish it all even if you start to feel better. SEEK IMMEDIATE MEDICAL CARE IF:  You have increasing pain or severe headaches.  You have nausea, vomiting, or drowsiness.  You have swelling  around your face.  You have vision problems.  You have a stiff neck.  You have difficulty breathing. MAKE SURE YOU:   Understand these instructions.  Will watch your condition.  Will get help right away if you are not doing well or get worse. Document Released: 03/17/2005 Document Revised: 08/01/2013 Document Reviewed: 04/01/2011 [facility] Patient Information 2015 [place], [place]. This information is not intended to replace advice given to you by your health care provider. Make sure you discuss any questions you have with your health care provider.

## 2014-12-02 ENCOUNTER — Other Ambulatory Visit: Payer: Self-pay | Admitting: Family Medicine

## 2014-12-25 ENCOUNTER — Ambulatory Visit (INDEPENDENT_AMBULATORY_CARE_PROVIDER_SITE_OTHER): Payer: BLUE CROSS/BLUE SHIELD | Admitting: Physician Assistant

## 2014-12-25 VITALS — BP 140/84 | HR 75 | Temp 98.4°F | Resp 16 | Ht 79.5 in | Wt 373.0 lb

## 2014-12-25 DIAGNOSIS — I10 Essential (primary) hypertension: Secondary | ICD-10-CM | POA: Diagnosis not present

## 2014-12-25 DIAGNOSIS — R609 Edema, unspecified: Secondary | ICD-10-CM

## 2014-12-25 MED ORDER — HYDROCHLOROTHIAZIDE 12.5 MG PO CAPS: 12.5000 mg | ORAL_CAPSULE | Freq: Every day | ORAL | Status: DC

## 2014-12-25 MED ORDER — LISINOPRIL 10 MG PO TABS: ORAL_TABLET | ORAL | Status: DC

## 2014-12-25 NOTE — Patient Instructions (Signed)
Peripheral Edema °You have swelling in your legs (peripheral edema). This swelling is due to excess accumulation of salt and water in your body. Edema may be a sign of heart, kidney or liver disease, or a side effect of a medication. It may also be due to problems in the leg veins. Elevating your legs and using special support stockings may be very helpful, if the cause of the swelling is due to poor venous circulation. Avoid long periods of standing, whatever the cause. °Treatment of edema depends on identifying the cause. Chips, pretzels, pickles and other salty foods should be avoided. Restricting salt in your diet is almost always needed. Water pills (diuretics) are often used to remove the excess salt and water from your body via urine. These medicines prevent the kidney from reabsorbing sodium. This increases urine flow. °Diuretic treatment may also result in lowering of potassium levels in your body. Potassium supplements may be needed if you have to use diuretics daily. Daily weights can help you keep track of your progress in clearing your edema. You should call your caregiver for follow up care as recommended. °SEEK IMMEDIATE MEDICAL CARE IF:  °· You have increased swelling, pain, redness, or heat in your legs. °· You develop shortness of breath, especially when lying down. °· You develop chest or abdominal pain, weakness, or fainting. °· You have a fever. °Document Released: 04/24/2004 Document Revised: 06/09/2011 Document Reviewed: 04/04/2009 °Exit[employer]® Patient Information ©2015 [employer]. This information is not intended to replace advice given to you by your health care David Gonzalez. Make sure you discuss any questions you have with your health care David Gonzalez. ° °

## 2014-12-25 NOTE — Progress Notes (Signed)
Subjective:    Patient ID: David Gonzalez, male    DOB: [birth year], 44 y.o.   MRN: [id number]  HPI Patient presents for refill of lisinopril and lower leg swelling. Has done well with lisinopril and has missed very few doses. Does not check blood pressure at home. Does not exercise and states that he does not follow a diet. Denies SOB, CP, HA, dizziness, or change in vision, however, has had bilateral lower leg edema for the past year that has gotten worse over the past 6 months. Swelling is not painful and denies change in color. Denies h/o MI or CHF.  Has never been evaluated or treated for edema in th past. Med allergy: hydrocodone.    Review of Systems  Constitutional: Negative for fever, diaphoresis and fatigue.  Respiratory: Negative for cough and shortness of breath.   Cardiovascular: Positive for leg swelling. Negative for chest pain and palpitations.  Musculoskeletal: Negative for joint swelling.  Neurological: Negative for dizziness, weakness, numbness and headaches.       Objective:   Physical Exam  Constitutional: He is oriented to person, place, and time. He appears well-developed and well-nourished. No distress.  Blood pressure 140/84, pulse 75, temperature 98.4 F (36.9 C), temperature source Oral, resp. rate 16, height 6' 7.5" (2.019 m), weight 373 lb (169.192 kg), SpO2 98 %.  HENT:  Head: Normocephalic and atraumatic.  Right Ear: External ear normal.  Left Ear: External ear normal.  Eyes: Conjunctivae are normal. Pupils are equal, round, and reactive to light. Right eye exhibits no discharge. Left eye exhibits no discharge. No scleral icterus.  Neck: Neck supple. No JVD present. Carotid bruit is not present.  Cardiovascular: Normal rate, regular rhythm, normal heart sounds and intact distal pulses.  Exam reveals no gallop and no friction rub.   No murmur heard. Pulmonary/Chest: Effort normal and breath sounds normal. No respiratory distress. He has no wheezes. He  has no rales.  Abdominal: Soft. Normal appearance and bowel sounds are normal. He exhibits no distension. There is no hepatosplenomegaly. There is no tenderness. There is no rebound and no guarding.  Musculoskeletal: He exhibits edema. He exhibits no tenderness.       Right lower leg: He exhibits edema (2+ pitted edema from foot to mid calf). He exhibits no tenderness, no bony tenderness, no deformity and no laceration.       Left lower leg: He exhibits edema (1+ pitted edema from foot to mid calf). He exhibits no tenderness, no bony tenderness, no deformity and no laceration.  Lymphadenopathy:    He has no cervical adenopathy.  Neurological: He is alert and oriented to person, place, and time. He has normal strength and normal reflexes. He displays no atrophy. No sensory deficit. He exhibits normal muscle tone.  Skin: Skin is warm, dry and intact. No rash noted. He is not diaphoretic. No erythema. No pallor.  Psychiatric: He has a normal mood and affect. His behavior is normal. Judgment and thought content normal.       Assessment & Plan:  1. Essential hypertension - lisinopril (PRINIVIL,ZESTRIL) 10 MG tablet; TAKE 1 TABLET (10 MG TOTAL) BY MOUTH DAILY.  Dispense: 90 tablet; Refill: 1 - hydrochlorothiazide (MICROZIDE) 12.5 MG capsule; Take 1 capsule (12.5 mg total) by mouth daily.  Dispense: 90 capsule; Refill: 0  2. Edema Side effects discussed and advised to stay hydrated. Should monitor BP as well and RTC in 4 weeks for f/u. Right leg swelling marginally worse than left side  and advised to wear compression socks daily and elevated feel at the end of each day. Life style modifications discussed, however, patient did not seem very motivated to make any changes. Highlighted on cutting back on sodium intake and fast foods. - hydrochlorothiazide (MICROZIDE) 12.5 MG capsule; Take 1 capsule (12.5 mg total) by mouth daily.  Dispense: 90 capsule; Refill: 0   Tishira Brewington PA-C  [facility] 12/26/2014 10:28 AM

## 2014-12-26 ENCOUNTER — Encounter: Payer: Self-pay | Admitting: Physician Assistant

## 2015-01-22 ENCOUNTER — Ambulatory Visit (INDEPENDENT_AMBULATORY_CARE_PROVIDER_SITE_OTHER): Payer: BLUE CROSS/BLUE SHIELD | Admitting: Family Medicine

## 2015-01-22 ENCOUNTER — Encounter: Payer: Self-pay | Admitting: Family Medicine

## 2015-01-22 VITALS — BP 123/84 | HR 86 | Temp 98.1°F | Resp 18 | Wt 374.2 lb

## 2015-01-22 DIAGNOSIS — L989 Disorder of the skin and subcutaneous tissue, unspecified: Secondary | ICD-10-CM | POA: Diagnosis not present

## 2015-01-22 DIAGNOSIS — E669 Obesity, unspecified: Secondary | ICD-10-CM

## 2015-01-22 DIAGNOSIS — I10 Essential (primary) hypertension: Secondary | ICD-10-CM

## 2015-01-22 LAB — COMPREHENSIVE METABOLIC PANEL
ALK PHOS: 58 U/L (ref 40–115)
ALT: 20 U/L (ref 9–46)
AST: 19 U/L (ref 10–40)
Albumin: 4.2 g/dL (ref 3.6–5.1)
BUN: 15 mg/dL (ref 7–25)
CO2: 29 mmol/L (ref 20–31)
Calcium: 9.6 mg/dL (ref 8.6–10.3)
Chloride: 103 mmol/L (ref 98–110)
Creat: 0.9 mg/dL (ref 0.60–1.35)
GLUCOSE: 110 mg/dL — AB (ref 65–99)
POTASSIUM: 4.3 mmol/L (ref 3.5–5.3)
Sodium: 138 mmol/L (ref 135–146)
Total Bilirubin: 0.5 mg/dL (ref 0.2–1.2)
Total Protein: 7.1 g/dL (ref 6.1–8.1)

## 2015-01-22 LAB — LIPID PANEL
CHOL/HDL RATIO: 5.9 ratio — AB (ref <=5.0)
Cholesterol: 184 mg/dL (ref 125–200)
HDL: 31 mg/dL — ABNORMAL LOW (ref >=40)
LDL CALC: 110 mg/dL (ref <130)
Triglycerides: 216 mg/dL — ABNORMAL HIGH (ref <150)
VLDL: 43 mg/dL — AB (ref <30)

## 2015-01-22 LAB — CBC
HCT: 41.9 % (ref 39.0–52.0)
Hemoglobin: 14.3 g/dL (ref 13.0–17.0)
MCH: 31.6 pg (ref 26.0–34.0)
MCHC: 34.1 g/dL (ref 30.0–36.0)
MCV: 92.7 fL (ref 78.0–100.0)
MPV: 11.4 fL (ref 8.6–12.4)
PLATELETS: 239 10*3/uL (ref 150–400)
RBC: 4.52 MIL/uL (ref 4.22–5.81)
RDW: 12.7 % (ref 11.5–15.5)
WBC: 6.5 10*3/uL (ref 4.0–10.5)

## 2015-01-22 NOTE — Patient Instructions (Signed)
Get compression stockings and wear daily If your leg swelling on left leg is not improved in 3 months, call me and we'll schedule an ultra sound.

## 2015-01-22 NOTE — Progress Notes (Signed)
Subjective:    Patient ID: David Gonzalez, male    DOB: 1970-06-10, 44 y.o.   MRN: [id number]  HPI This is a pleasant patient who presents today for follow up leg edema and left shin swelling. He was started on HCTZ a month ago. He has noticed that his leg edema has decreased,  his left leg has improved more than the right. His swelling is absent when he gets up in the morning and accumulates as the day progresses. 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  He has an area on his right anterior shin that is swollen. He was seen by orthopedics and an Xray was obtained. He was told the xray was negative and that the area is likely a cyst. It has decreased in size over the last month. It does not hurt or drain.   Past Medical History  Diagnosis Date  . Complication of anesthesia     reported to be violent waking from anesthesia at age 19  . Mental disorder     mildly claustrophobic  . Hypertension   . Pneumonia     12/13  . FUXNATFT(732.2)    Past Surgical History  Procedure Laterality Date  . Knee arthroscopy    . Tonsillectomy    . Anterior cervical decomp/discectomy fusion  04/30/2011    Procedure: ANTERIOR CERVICAL DECOMPRESSION/DISCECTOMY FUSION 1 LEVEL;  Surgeon: Dahlia Bailiff, MD;  Location: [facility];  Service: Orthopedics;  Laterality: N/A;  ACDF C5-6   Family History  Problem Relation Age of Onset  . Diabetes Mother   . Hypertension Mother   . Arthritis Mother   . Neuropathy Mother   . Hypertension Father   . Cancer Father   . Hypertension Sister   . Hypertension Maternal Grandmother   . Schizophrenia Maternal Grandfather   . Hypertension Paternal Grandmother   . Hypertension Paternal Grandfather    Review of Systems  Constitutional: Negative for fatigue and unexpected weight change.  Respiratory: Negative for cough and shortness of breath.   Cardiovascular: Positive for leg swelling. Negative for chest pain and  palpitations.  Gastrointestinal: Negative for abdominal pain, diarrhea and constipation.  Genitourinary: Negative for dysuria, frequency and hematuria.  Musculoskeletal: Positive for neck pain (chronic) and neck stiffness (chronic).  Neurological: Negative for dizziness, light-headedness and headaches.      Objective:   Physical Exam Physical Exam  Constitutional: Oriented to person, place, and time. He appears well-developed and well-nourished.  HENT:  Head: Normocephalic and atraumatic.  Eyes: Conjunctivae are normal.  Neck: Normal range of motion. Neck supple.  Cardiovascular: Normal rate, regular rhythm and normal heart sounds.   Pulmonary/Chest: Effort normal and breath sounds normal.  Musculoskeletal: Normal range of motion.  Neurological: Alert and oriented to person, place, and time.  Skin: Skin is warm and dry.  Psychiatric: Normal mood and affect. Behavior is normal. Judgment and thought content normal.  Vitals reviewed.   Right shin with 7cm x 4 cm area of fullness. It is firm, but not hard. No erythema.   BP 123/84 mmHg  Pulse 86  Temp(Src) 98.1 F (36.7 C) (Oral)  Resp 18  Wt 374 lb 3.2 oz (169.736 kg) Wt Readings from Last 3 Encounters:  01/22/15 374 lb 3.2 oz (169.736 kg)  12/25/14 373 lb (169.192 kg)  03/08/14 384 lb (174.181 kg)   Depression screen [facility] 2/9 01/22/2015 12/25/2014 12/25/2014  Decreased Interest 0 0 0  Down,  Depressed, Hopeless 0 0 0  PHQ - 2 Score 0 0 0      Assessment & Plan:  1. Essential hypertension - CBC - Comprehensive metabolic panel - Lipid panel - TSH  2. Leg lesion - offered him ultra sound evaluation, as it is getting smaller, he will continue to monitor and will RTC if it does not continue to decrease in size or if it becomes painful  3. Obesity - encouraged him to increase exercise and decrease soda, sweets, starchy foods - Comprehensive metabolic panel - Lipid panel - TSH  - follow up in 6 months  Clarene Reamer,  FNP-BC  [facility]  01/26/2015 3:28 PM

## 2015-01-23 LAB — TSH: TSH: 0.625 u[IU]/mL (ref 0.350–4.500)

## 2015-01-24 ENCOUNTER — Other Ambulatory Visit: Payer: Self-pay | Admitting: Family Medicine

## 2015-01-24 DIAGNOSIS — E786 Lipoprotein deficiency: Principal | ICD-10-CM

## 2015-01-24 DIAGNOSIS — E785 Hyperlipidemia, unspecified: Secondary | ICD-10-CM

## 2015-01-24 MED ORDER — ATORVASTATIN CALCIUM 20 MG PO TABS: 20.0000 mg | ORAL_TABLET | Freq: Every day | ORAL | Status: DC

## 2015-03-26 ENCOUNTER — Other Ambulatory Visit: Payer: Self-pay

## 2015-03-26 DIAGNOSIS — I10 Essential (primary) hypertension: Secondary | ICD-10-CM

## 2015-03-26 MED ORDER — HYDROCHLOROTHIAZIDE 12.5 MG PO CAPS: 12.5000 mg | ORAL_CAPSULE | Freq: Every day | ORAL | Status: DC

## 2015-07-02 ENCOUNTER — Other Ambulatory Visit: Payer: Self-pay

## 2015-07-02 DIAGNOSIS — I10 Essential (primary) hypertension: Secondary | ICD-10-CM

## 2015-07-02 MED ORDER — LISINOPRIL 10 MG PO TABS: ORAL_TABLET | ORAL | Status: DC

## 2015-07-23 ENCOUNTER — Ambulatory Visit: Payer: Self-pay | Admitting: Family Medicine

## 2015-07-27 ENCOUNTER — Other Ambulatory Visit: Payer: Self-pay

## 2015-07-27 DIAGNOSIS — I10 Essential (primary) hypertension: Secondary | ICD-10-CM

## 2015-07-27 MED ORDER — LISINOPRIL 10 MG PO TABS: ORAL_TABLET | ORAL | Status: DC

## 2015-07-29 ENCOUNTER — Other Ambulatory Visit: Payer: Self-pay | Admitting: Family Medicine

## 2015-08-05 ENCOUNTER — Other Ambulatory Visit: Payer: Self-pay | Admitting: Urgent Care

## 2015-09-10 ENCOUNTER — Encounter: Payer: Self-pay | Admitting: Family Medicine

## 2015-09-10 ENCOUNTER — Ambulatory Visit (INDEPENDENT_AMBULATORY_CARE_PROVIDER_SITE_OTHER): Payer: BLUE CROSS/BLUE SHIELD | Admitting: Family Medicine

## 2015-09-10 VITALS — BP 121/79 | HR 106 | Ht 79.0 in | Wt 373.4 lb

## 2015-09-10 DIAGNOSIS — E669 Obesity, unspecified: Secondary | ICD-10-CM | POA: Diagnosis not present

## 2015-09-10 DIAGNOSIS — I10 Essential (primary) hypertension: Secondary | ICD-10-CM

## 2015-09-10 DIAGNOSIS — L84 Corns and callosities: Secondary | ICD-10-CM

## 2015-09-10 DIAGNOSIS — E782 Mixed hyperlipidemia: Secondary | ICD-10-CM | POA: Diagnosis not present

## 2015-09-10 DIAGNOSIS — J3089 Other allergic rhinitis: Secondary | ICD-10-CM | POA: Diagnosis not present

## 2015-09-10 NOTE — Patient Instructions (Addendum)
- Plan fasting labs in near future. Along with CPExam.    Top Ten Foods for Health  1. Water Drink at least 8 to 12 cups of water daily. Consume half of your body weight in pounds, is the amount of water in ounces to drink daily.  Ie: a 200lb person = 100 oz water daily  2. Dark Green Vegetables Eat dark green vegetables at least three to four times a week. Good options include broccoli, peppers, brussel sprouts and leafy greens like kale and spinach.  3. Whole Grains Whole grains should be included in your diet at least two to three times daily. Look for whole wheat flour, rye, oatmeal, barley, amaranth, quinoa or a multigrain. A good source of fiber includes 3 to 4 grams of fiber per serving. A great source has 5 or more grams of fiber per serving.  4. Beans and Lentils Try to eat a bean-based meal at least once a week. Try to add legumes, including beans and lentils, to soups, stews, casseroles, salads and dips or eat them plain.  5. Fish Try to eat two to three serving of fish a week. A serving consists of 3 to 4 ounces of cooked fish. Good choices are salmon, trout, herring, bluefish, sardines and tuna.  6. Berries Include two to four servings of fruit in your diet each day. Try to eat berries such as raspberries, blueberries, blackberries and strawberries.  7. Winter Squash Eat butternut and acorn squash as well as other richly pigmented dark orange and green colored vegetables like sweet potato, cantaloupe and mango.  8. Soy 25 grams of soy protein a day is recommended as part of a low-fat diet to help lower cholesterol levels. Try tofu, soymilk, edamame soybeans, tempeh and texturized vegetable protein (TVP).  9. Flaxseed, Nuts and Seeds Add 1 to 2 tablespoons of ground flaxseed or other seeds to food each day or include a moderate amount of nuts - 1/4 cup - in your daily diet.  10. Organic Yogurt Men and women between 72 and 5 years of age need 1000 milligrams of calcium a  day and 1200 milligrams if 8 or older. Eat calcium-rich foods such as nonfat or low-fat dairy products three to four times a day. Include organic choices.   Guidelines for a Low Cholesterol, Low Saturated Fat Diet   Fats - Limit total intake of fats and oils. - Avoid butter, stick margarine, shortening, lard, palm and coconut oils. - Limit mayonnaise, salad dressings, gravies and sauces, unless they are homemade with low-fat ingredients. - Limit chocolate. - Choose low-fat and nonfat products, such as low-fat mayonnaise, low-fat or non-hydrogenated peanut butter, low-fat or fat-free salad dressings and nonfat gravy. - Use vegetable oil, such as canola or olive oil. - Look for margarine that does not contain trans fatty acids. - Use nuts in moderate amounts. - Read ingredient labels carefully to determine both amount and type of fat present in foods. Limit saturated and trans fats! - Avoid high-fat processed and convenience foods.  Meats and Meat Alternatives - Choose fish, chicken, [place] and lean meats. - Use dried beans, peas, lentils and tofu. - Limit egg yolks to three to four per week. - If you eat red meat, limit to no more than three servings per week and choose loin or round cuts. - Avoid fatty meats, such as bacon, sausage, franks, luncheon meats and ribs. - Avoid all organ meats, including liver.  Dairy - Choose nonfat or low-fat milk, yogurt and  cottage cheese. - Most cheeses are high in fat. Choose cheeses made from non-fat milk, such as mozzarella and ricotta cheese. - Choose light or fat-free cream cheese and sour cream. - Avoid cream and sauces made with cream.  Fruits and Vegetables - Eat a wide variety of fruits and vegetables. - Use lemon juice, vinegar or "mist" olive oil on vegetables. - Avoid adding sauces, fat or oil to vegetables.  Breads, Cereals and Grains - Choose whole-grain breads, cereals, pastas and rice. - Avoid high-fat snack foods, such as  granola, cookies, pies, pastries, doughnuts and croissants.  Cooking Tips - Avoid deep fried foods. - Trim visible fat off meats and remove skin from poultry before cooking. - Bake, broil, boil, poach or roast poultry, fish and lean meats. - Drain and discard fat that drains out of meat as you cook it. - Add little or no fat to foods. - Use vegetable oil sprays to grease pans for cooking or baking. - Steam vegetables. - Use herbs or no-oil marinades to flavor foods.

## 2015-09-10 NOTE — Progress Notes (Signed)
Marjory Sneddon, D.O. Primary care at [facility]  Subjective:    CC: New pt, here to establish care.   HPI: David Gonzalez is a pleasant 45 y.o. male who presents to [place] at [facility] today, To establish care.  Previously seen at [facility] urgent care and wishes to transfer his care here now.  He needs medication refills and has a concern about a "spot on the bottom of his foot".   HTN:  HCTZ for approx 6 mo. Asymptomatic, stable  [facility]:  seen for discectomy and knee arthroscopy.  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    Married, 2 kids ages 70, 21.   Wife works- as Marine scientist- teaches at [employer].   CC: spot on the btm of his L foot- been there on and off one mo. No injury.  Made worse- nothing- hurts at random times, But only pain with walking on it.      Past Medical History  Diagnosis Date  . Complication of anesthesia     reported to be violent waking from anesthesia at age 4  . Mental disorder     mildly claustrophobic  . Hypertension   . Pneumonia     12/13  . [id number]     Past Surgical History  Procedure Laterality Date  . Knee arthroscopy    . Tonsillectomy    . Anterior cervical decomp/discectomy fusion  04/30/2011    Procedure: ANTERIOR CERVICAL DECOMPRESSION/DISCECTOMY FUSION 1 LEVEL;  Surgeon: Dahlia Bailiff, MD;  Location: [place];  Service: Orthopedics;  Laterality: N/A;  ACDF C5-6    Family History  Problem Relation Age of Onset  . Diabetes Mother   . Hypertension Mother   . Arthritis Mother   . Neuropathy Mother   . Hypertension Father   . Cancer Father   . Hypertension Sister   . Hypertension Maternal Grandmother   . Schizophrenia Maternal Grandfather   . Hypertension Paternal Grandmother   . Hypertension Paternal Grandfather     History  Drug Use No  ,  History  Alcohol Use No  ,    History  Smoking status  . Never Smoker   Smokeless tobacco  . Never Used  ,  History  Sexual Activity  . Sexual Activity: Yes  . Birth Control/ Protection: Pill    Patient's Medications  New Prescriptions   No medications on file  Previous Medications   ASPIRIN 81 MG TABLET    Take 81 mg by mouth daily.   ATORVASTATIN (LIPITOR) 20 MG TABLET    TAKE 1 TABLET BY MOUTH EVERY DAY   HYDROCHLOROTHIAZIDE (MICROZIDE) 12.5 MG CAPSULE    Take 1 capsule (12.5 mg total) by mouth daily.   LISINOPRIL (PRINIVIL,ZESTRIL) 10 MG TABLET    TAKE 1 TABLET (10 MG TOTAL) BY MOUTH DAILY.   LORATADINE (CLARITIN) 10 MG TABLET    Take 10 mg by mouth daily.  Modified Medications   No medications on file  Discontinued Medications   No medications on file    ALLERGIES: Hydrocodone  Review of Systems: General:   Denies fever, chills, appetite changes, unexplained weight loss.  Optho/Auditory:   Denies visual changes, blurred vision/LOV, ringing in ears/ diff hearing Respiratory:   Denies SOB, DOE, cough, wheezing.  Cardiovascular:   Denies chest pain, palpitations, new onset peripheral edema  Gastrointestinal:   Denies nausea, vomiting, diarrhea.  Genitourinary:    Denies dysuria, increased frequency, flank pain.  Endocrine:     Denies hot or cold intolerance, polyuria, polydipsia. Musculoskeletal:  Denies unexplained myalgias, joint swelling, arthralgias, gait problems.  Skin:  Denies rash, suspicious lesions or new/ changes in moles Neurological:    Denies dizziness, syncope, unexplained weakness, lightheadedness, numbness  Psychiatric/Behavioral:   Denies mood changes, suicidal or homicidal ideations, hallucinations   Objective:   Blood pressure 121/79, pulse 106, height 6\' 7"  (2.007 m), weight 373 lb 6.4 oz (169.373 kg). Body mass index is 42.05 kg/(m^2).  General: Well Developed, well nourished, and in no acute distress.  Neuro: Alert and oriented x3, extra-ocular muscles intact, sensation  grossly intact.  HEENT: Normocephalic, atraumatic, pupils equal round reactive to light, neck supple, no gross masses, no carotid bruits, no JVD apprec Skin: no gross suspicious lesions or rashes, Patient with thickened skin consistent with callus on the bottom of left foot lateral, distal aspect, nontender to palpation. No irregularities Cardiac: Regular rate and rhythm, no murmurs rubs or gallops.  Respiratory: Essentially clear to auscultation bilaterally. Not using accessory muscles, speaking in full sentences.  Abdominal: Soft, not grossly distended Musculoskeletal: Ambulates w/o diff, FROM * 4 ext.  Vasc: less 2 sec cap RF, warm and pink  Psych:  No HI/SI, judgement and insight good.    Impression and Recommendations:    The patient was counselled, risk factors were discussed, anticipatory guidance given.  Essential hypertension Well controlled, at goal, continue current meds. Discussed with patient lifestyle modification  Environmental and seasonal allergies Stable currently asymptomatic.  Callus of foot Discussed with patient that we can par it down today using a sterile scalpel.   Patient declines.   I recommend he use an over-the-counter salicylic acid of 123456 daily on it and use donut surrounding the lesion to take some of the pressure off of it and help with pain.  He understands this will take a much longer time to resolve... Months.  Mixed hyperlipidemia Patient will make an appointment in the near future for fasting lab work.  Obesity Discussed with patient that we treat diabetes is any other disease and we can consider pharmacological treatment in the future if he would like.  Diet and lifestyle modifications discussed with patient.    Gross side effects, risk and benefits, and alternatives of medications discussed with patient.  Patient is aware that all medications have potential side effects and we are unable to predict every sideeffect or drug-drug interaction that  may occur.  Expresses verbal understanding and consents to current therapy plan and treatment regiment.  Note: This document was prepared using Dragon voice recognition software and may include unintentional dictation errors.

## 2015-09-11 ENCOUNTER — Encounter: Payer: Self-pay | Admitting: Family Medicine

## 2015-09-11 DIAGNOSIS — J3089 Other allergic rhinitis: Secondary | ICD-10-CM | POA: Insufficient documentation

## 2015-09-11 DIAGNOSIS — L84 Corns and callosities: Secondary | ICD-10-CM | POA: Insufficient documentation

## 2015-09-11 DIAGNOSIS — E782 Mixed hyperlipidemia: Secondary | ICD-10-CM | POA: Insufficient documentation

## 2015-09-11 NOTE — Assessment & Plan Note (Signed)
Discussed with patient that we treat diabetes is any other disease and we can consider pharmacological treatment in the future if he would like.  Diet and lifestyle modifications discussed with patient.

## 2015-09-11 NOTE — Assessment & Plan Note (Signed)
Well controlled, at goal, continue current meds. Discussed with patient lifestyle modification

## 2015-09-11 NOTE — Assessment & Plan Note (Signed)
Patient will make an appointment in the near future for fasting lab work.

## 2015-09-11 NOTE — Assessment & Plan Note (Signed)
Discussed with patient that we can par it down today using a sterile scalpel.   Patient declines.   I recommend he use an over-the-counter salicylic acid of 123456 daily on it and use donut surrounding the lesion to take some of the pressure off of it and help with pain.  He understands this will take a much longer time to resolve... Months.

## 2015-09-11 NOTE — Assessment & Plan Note (Signed)
Stable currently asymptomatic.

## 2015-09-12 ENCOUNTER — Other Ambulatory Visit: Payer: Self-pay | Admitting: Family Medicine

## 2015-09-12 ENCOUNTER — Ambulatory Visit: Payer: Self-pay | Admitting: Family Medicine

## 2015-09-12 DIAGNOSIS — I10 Essential (primary) hypertension: Secondary | ICD-10-CM

## 2015-09-12 MED ORDER — HYDROCHLOROTHIAZIDE 12.5 MG PO CAPS: 12.5000 mg | ORAL_CAPSULE | Freq: Every day | ORAL | Status: DC

## 2015-09-12 MED ORDER — ATORVASTATIN CALCIUM 20 MG PO TABS: 20.0000 mg | ORAL_TABLET | Freq: Every day | ORAL | Status: DC

## 2015-09-12 MED ORDER — LISINOPRIL 10 MG PO TABS: ORAL_TABLET | ORAL | Status: DC

## 2015-09-21 ENCOUNTER — Other Ambulatory Visit: Payer: Self-pay | Admitting: Family Medicine

## 2015-10-08 ENCOUNTER — Encounter: Payer: Self-pay | Admitting: Family Medicine

## 2015-10-08 ENCOUNTER — Ambulatory Visit (INDEPENDENT_AMBULATORY_CARE_PROVIDER_SITE_OTHER): Payer: BLUE CROSS/BLUE SHIELD | Admitting: Family Medicine

## 2015-10-08 VITALS — BP 127/79 | HR 74 | Resp 17 | Ht >= 80 in | Wt 377.0 lb

## 2015-10-08 DIAGNOSIS — E782 Mixed hyperlipidemia: Secondary | ICD-10-CM | POA: Diagnosis not present

## 2015-10-08 DIAGNOSIS — R5383 Other fatigue: Secondary | ICD-10-CM

## 2015-10-08 DIAGNOSIS — E669 Obesity, unspecified: Secondary | ICD-10-CM

## 2015-10-08 DIAGNOSIS — L84 Corns and callosities: Secondary | ICD-10-CM

## 2015-10-08 DIAGNOSIS — I10 Essential (primary) hypertension: Secondary | ICD-10-CM

## 2015-10-08 DIAGNOSIS — J3089 Other allergic rhinitis: Secondary | ICD-10-CM

## 2015-10-08 DIAGNOSIS — Z23 Encounter for immunization: Secondary | ICD-10-CM | POA: Diagnosis not present

## 2015-10-08 MED ORDER — ATORVASTATIN CALCIUM 20 MG PO TABS: 20.0000 mg | ORAL_TABLET | Freq: Every day | ORAL | Status: DC

## 2015-10-08 MED ORDER — LISINOPRIL 10 MG PO TABS: ORAL_TABLET | ORAL | Status: DC

## 2015-10-08 MED ORDER — HYDROCHLOROTHIAZIDE 12.5 MG PO CAPS: 12.5000 mg | ORAL_CAPSULE | Freq: Every day | ORAL | Status: DC

## 2015-10-08 NOTE — Progress Notes (Signed)
Subjective:    CC: CPE  HPI: David Gonzalez is a 45 y.o. male who presents to [place] at [facility] today for a yrly health maintenance exam.    Health Maintenance Summary Reviewed and updated, unless pt declines services.  Tobacco History Reviewed: Non-smoker - never Alcohol: No concerns, none at all Exercise Habits: rare , Sedentary lifestyle STD concerns: none Drug Use: None Birth control method: BCP's wife Testicular/penile concerns: no Cancer Family History:  Prostate CA in father- in mid-60's; skin CA-  Went to lungs and passed from that almost 107.    HTN: Asymptomatic, stable  [facility]: OA, various orthopedic issues  Dermatology:  yearly skin screening evals  Callus-  on bottom of left foot is much improved. Salicylic acid work great. No longer tender.  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     Past Medical History  Diagnosis Date  . Complication of anesthesia     reported to be violent waking from anesthesia at age 55  . Mental disorder     mildly claustrophobic  . [id number]      Past Surgical History  Procedure Laterality Date  . Knee arthroscopy    . Tonsillectomy    . Anterior cervical decomp/discectomy fusion  04/30/2011    Procedure: ANTERIOR CERVICAL DECOMPRESSION/DISCECTOMY FUSION 1 LEVEL;  Surgeon: Dahlia Bailiff, MD;  Location: [place];  Service: Orthopedics;  Laterality: N/A;  ACDF C5-6     Family History  Problem Relation Age of Onset  . Diabetes Mother   . Hypertension Mother   . Arthritis Mother   . Neuropathy Mother   . Hypertension Father   . Cancer Father   . Hypertension Sister   . Hypertension Maternal Grandmother   . Schizophrenia Maternal Grandfather   . Hypertension Paternal Grandmother   . Hypertension Paternal Grandfather      History  Drug Use No  ,   History  Alcohol Use No  ,   History  Smoking status  . Never Smoker     Smokeless tobacco  . Never Used  ,   History  Sexual Activity  . Sexual Activity: Yes  . Birth Control/ Protection: Pill       Patient's Medications  New Prescriptions   No medications on file  Previous Medications   ASPIRIN 81 MG TABLET    Take 81 mg by mouth daily.   ATORVASTATIN (LIPITOR) 20 MG TABLET    Take 1 tablet (20 mg total) by mouth daily.   HYDROCHLOROTHIAZIDE (MICROZIDE) 12.5 MG CAPSULE    Take 1 capsule (12.5 mg total) by mouth daily.   LISINOPRIL (PRINIVIL,ZESTRIL) 10 MG TABLET    TAKE 1 TABLET (10 MG TOTAL) BY MOUTH DAILY.   LORATADINE (CLARITIN) 10 MG TABLET    Take 10 mg by mouth daily.  Modified Medications   No medications on file  Discontinued Medications   No medications on file   Allergies  Allergen Reactions  . Hydrocodone Other (See Comments)    Skin turns red     Review of Systems:   ( Completed via her adult medical history intake form today ) General:  Denies fever, chills, appetite changes, unexplained weight loss.  Respiratory: Denies SOB, DOE, cough, wheezing.  Cardiovascular: Denies chest pain, palpitations.  Gastrointestinal: Denies nausea, vomiting, diarrhea, abdominal pain.  Genitourinary: Denies dysuria, increased frequency, flank pain. Endocrine: Denies hot or cold intolerance,  polyuria, polydipsia. Musculoskeletal: Denies myalgias, back pain, joint swelling, arthralgias, gait problems.  Skin: Denies pallor, rash, suspicious lesions.  Neurological: Denies dizziness, seizures, syncope, unexplained weakness, lightheadedness, numbness and headaches.  Psychiatric/Behavioral: Denies mood changes, suicidal or homicidal ideations, hallucinations, sleep disturbances.   Objective:    Blood pressure 127/79, pulse 74, resp. rate 17, height 6\' 8"  (2.032 m), weight 377 lb (171.006 kg), SpO2 99 %.  Body mass index is 41.42 kg/(m^2).  General Appearance:    Alert, cooperative, no distress, appears stated age  Head:    Normocephalic, without  obvious abnormality, atraumatic  Eyes:    PERRL, conjunctiva/corneas clear, EOM's intact, fundi    benign, both eyes  Ears:    Normal TM's and external ear canals, both ears  Nose:   Nares normal, septum midline, mucosa normal, no drainage    or sinus tenderness  Throat:   Lips w/o lesion, mucosa moist, and tongue normal; teeth and   gums normal  Neck:   Supple, symmetrical, trachea midline, no adenopathy;    thyroid:  no enlargement/tenderness/nodules; no carotid   bruit or JVD  Back:     Symmetric, no curvature, ROM normal, no CVA tenderness  Lungs:     Clear to auscultation bilaterally, respirations unlabored, no       Wh/ R/ R  Chest Wall:    No tenderness or gross deformity; normal excursion   Heart:    Regular rate and rhythm, S1 and S2 normal, no murmur, rub   or gallop  Abdomen:     Soft, non-tender, bowel sounds active all four quadrants, NO   G/R/R, no masses, no organomegaly  Genitalia:    Ext genitalia: without lesion, no penile rash or discharge, no hernias appreciated   Rectal:    Normal tone, prostate WNL's and equal b/l, no tenderness; guaiac negative stool  Extremities:   Extremities normal, atraumatic, no cyanosis or gross edema  Pulses:   2+ and symmetric all extremities  Skin:   Warm, dry, Skin color, texture, turgor normal, no obvious rashes or lesions  M-Sk:   Ambulates * 4 w/o difficulty, no gross deformities, tone WNL  Neurologic:   CNII-XII intact, normal strength, sensation and reflexes    throughout      Impression and Recommendations:    Callus of foot Resolved  Essential hypertension Stable good control  Mixed hyperlipidemia Tolerating medications without side effect. Extensive counseling done today  Obesity Diet lifestyle modifications discussed with patient. Normal BMI reviewed.  mild occ Fatigue We'll obtain labs. Discussed with patient fatigue can be a sign of obstructive sleep apnea, physical deconditioning as well as several other things.  We'll await labs and re-address in near future. Advised exercise   Please see AVS handed out to patient at the end of our visit for further patient instructions/ counseling done pertaining to today's office visit.  1) Anticipatory Guidance: Discussed importance of wearing a seatbelt while driving, not texting while driving; changing batteries in smoke detector at least once annually; sunscreen when outside along with skin surveillance; eating a balanced and modest diet; physical activity at least 25 minutes per day or 150 min/ week moderate to intense activity.  2) Immunizations / Screenings / Labs:  All immunizations are up-to-date per recommendations or will be updated today. Patient is due for dental and vision screens which pt will schedule independently. Obtain CBC, CMP, HgA1c, Lipid panel, TSH and vit D when fasting.   3) Weight:  BMI does indicates morbidly obese.  Discussed goal of losing 5-10% of current body weight which would improve arthritis symptoms, overall feelings of well being and improve objective health data. Improve nutrient density of diet through increasing intake of fruits and vegetables and decreasing saturated/trans fats, white flour products and refined sugars.   Gross side effects, risk and benefits, and alternatives of medications discussed with patient.  Patient is aware that all medications have potential side effects and we are unable to predict every sideeffect or drug-drug interaction that may occur.  Expresses verbal understanding and consents to current therapy plan and treatment regiment.  Follow-up preventative CPE in 1 year. Follow-up office visit pending lab work.  F/up sooner for chronic care management and/or prn

## 2015-10-08 NOTE — Patient Instructions (Signed)
rtc 1 wk to discuss lab results

## 2015-10-09 LAB — VITAMIN D 25 HYDROXY (VIT D DEFICIENCY, FRACTURES): Vit D, 25-Hydroxy: 31 ng/mL (ref 30–100)

## 2015-10-09 LAB — LIPID PANEL
CHOL/HDL RATIO: 3.6 ratio (ref <=5.0)
Cholesterol: 136 mg/dL (ref 125–200)
HDL: 38 mg/dL — AB (ref >=40)
LDL CALC: 84 mg/dL (ref <130)
Triglycerides: 72 mg/dL (ref <150)
VLDL: 14 mg/dL (ref <30)

## 2015-10-09 LAB — HEMOGLOBIN A1C
HEMOGLOBIN A1C: 5.8 % — AB (ref <5.7)
Mean Plasma Glucose: 120 mg/dL

## 2015-10-09 LAB — COMPLETE METABOLIC PANEL WITH GFR
ALT: 20 U/L (ref 9–46)
AST: 17 U/L (ref 10–40)
Albumin: 4.3 g/dL (ref 3.6–5.1)
Alkaline Phosphatase: 63 U/L (ref 40–115)
BUN: 17 mg/dL (ref 7–25)
CALCIUM: 9 mg/dL (ref 8.6–10.3)
CHLORIDE: 106 mmol/L (ref 98–110)
CO2: 26 mmol/L (ref 20–31)
CREATININE: 0.8 mg/dL (ref 0.60–1.35)
GFR, Est African American: >89 mL/min (ref >=60)
GFR, Est Non African American: >89 mL/min (ref >=60)
Glucose, Bld: 110 mg/dL — ABNORMAL HIGH (ref 65–99)
Potassium: 4.2 mmol/L (ref 3.5–5.3)
Sodium: 141 mmol/L (ref 135–146)
TOTAL PROTEIN: 6.8 g/dL (ref 6.1–8.1)
Total Bilirubin: 0.6 mg/dL (ref 0.2–1.2)

## 2015-10-09 LAB — CBC WITH DIFFERENTIAL/PLATELET
BASOS PCT: 0 %
Basophils Absolute: 0 cells/uL (ref 0–200)
Eosinophils Absolute: 144 cells/uL (ref 15–500)
Eosinophils Relative: 2 %
HEMATOCRIT: 42.9 % (ref 38.5–50.0)
Hemoglobin: 14.3 g/dL (ref 13.2–17.1)
LYMPHS ABS: 1656 {cells}/uL (ref 850–3900)
LYMPHS PCT: 23 %
MCH: 31.2 pg (ref 27.0–33.0)
MCHC: 33.3 g/dL (ref 32.0–36.0)
MCV: 93.5 fL (ref 80.0–100.0)
MONO ABS: 432 {cells}/uL (ref 200–950)
MPV: 11.9 fL (ref 7.5–12.5)
Monocytes Relative: 6 %
NEUTROS ABS: 4968 {cells}/uL (ref 1500–7800)
Neutrophils Relative %: 69 %
Platelets: 197 10*3/uL (ref 140–400)
RBC: 4.59 MIL/uL (ref 4.20–5.80)
RDW: 13 % (ref 11.0–15.0)
WBC: 7.2 10*3/uL (ref 3.8–10.8)

## 2015-10-09 LAB — TSH: TSH: 1.08 m[IU]/L (ref 0.40–4.50)

## 2015-10-09 NOTE — Progress Notes (Signed)
Quick Note:  I will discuss the results of these tests with the patient at our upcoming planned follow-up office visit. ______

## 2015-10-14 NOTE — Assessment & Plan Note (Signed)
Stable good control. 

## 2015-10-14 NOTE — Assessment & Plan Note (Signed)
Diet lifestyle modifications discussed with patient. Normal BMI reviewed.

## 2015-10-14 NOTE — Assessment & Plan Note (Signed)
Resolved

## 2015-10-14 NOTE — Assessment & Plan Note (Signed)
Tolerating medications without side effect. Extensive counseling done today

## 2015-10-14 NOTE — Assessment & Plan Note (Signed)
We'll obtain labs. Discussed with patient fatigue can be a sign of obstructive sleep apnea, physical deconditioning as well as several other things. We'll await labs and re-address in near future. Advised exercise

## 2015-10-15 ENCOUNTER — Ambulatory Visit (INDEPENDENT_AMBULATORY_CARE_PROVIDER_SITE_OTHER): Payer: BLUE CROSS/BLUE SHIELD | Admitting: Family Medicine

## 2015-10-15 ENCOUNTER — Encounter: Payer: Self-pay | Admitting: Family Medicine

## 2015-10-15 VITALS — BP 129/81 | HR 66 | Ht >= 80 in | Wt 375.3 lb

## 2015-10-15 DIAGNOSIS — E559 Vitamin D deficiency, unspecified: Secondary | ICD-10-CM

## 2015-10-15 DIAGNOSIS — E782 Mixed hyperlipidemia: Secondary | ICD-10-CM | POA: Diagnosis not present

## 2015-10-15 DIAGNOSIS — R7303 Prediabetes: Secondary | ICD-10-CM

## 2015-10-15 DIAGNOSIS — E786 Lipoprotein deficiency: Secondary | ICD-10-CM

## 2015-10-15 DIAGNOSIS — I10 Essential (primary) hypertension: Secondary | ICD-10-CM | POA: Diagnosis not present

## 2015-10-15 NOTE — Assessment & Plan Note (Signed)
Counseling done 

## 2015-10-15 NOTE — Assessment & Plan Note (Signed)
Continue meds. Counseling done.  Prefer blood pressure to be 119/79 or less at patient's age.  However he is at goal of under 140/90.  Weight loss, low salt diet, exercise, and other extensive counseling performed today.

## 2015-10-15 NOTE — Assessment & Plan Note (Signed)
Extensive counseling done. Advised to use the lucid. Cut back on fats and especially carbohydrates. Eat more fiber. Exercise to a goal of 30 minutes daily. Recheck 4-6 months.

## 2015-10-15 NOTE — Progress Notes (Signed)
Subjective:    Chief Complaint  Patient presents with  . Results    HPI: David Gonzalez is a 45 y.o. male who presents to [place] at [facility] today To review all recent labs that were done on 10/09/2015.  His wife was in nurse at [employer] [facility] is here as well.     Past Medical History  Diagnosis Date  . Complication of anesthesia     reported to be violent waking from anesthesia at age 78  . Mental disorder     mildly claustrophobic  . BHALPFXT(024.0)      Past Surgical History  Procedure Laterality Date  . Knee arthroscopy    . Tonsillectomy    . Anterior cervical decomp/discectomy fusion  04/30/2011    Procedure: ANTERIOR CERVICAL DECOMPRESSION/DISCECTOMY FUSION 1 LEVEL;  Surgeon: Dahlia Bailiff, MD;  Location: [facility];  Service: Orthopedics;  Laterality: N/A;  ACDF C5-6     Family History  Problem Relation Age of Onset  . Diabetes Mother   . Hypertension Mother   . Arthritis Mother   . Neuropathy Mother   . Hypertension Father   . Cancer Father   . Hypertension Sister   . Hypertension Maternal Grandmother   . Schizophrenia Maternal Grandfather   . Hypertension Paternal Grandmother   . Hypertension Paternal Grandfather      History  Drug Use No  ,  History  Alcohol Use No  ,  History  Smoking status  . Never Smoker   Smokeless tobacco  . Never Used  ,  History  Sexual Activity  . Sexual Activity: Yes  . Birth Control/ Protection: Pill      Current Outpatient Prescriptions on File Prior to Visit  Medication Sig Dispense Refill  . aspirin 81 MG tablet Take 81 mg by mouth daily.    [name] Kitchen atorvastatin (LIPITOR) 20 MG tablet Take 1 tablet (20 mg total) by mouth daily. 90 tablet 1  . hydrochlorothiazide (MICROZIDE) 12.5 MG capsule Take 1 capsule (12.5 mg total) by mouth daily. 90 capsule 1  . lisinopril (PRINIVIL,ZESTRIL) 10 MG tablet TAKE 1 TABLET (10 MG TOTAL) BY MOUTH DAILY. 90 tablet 1  . loratadine (CLARITIN) 10 MG tablet  Take 10 mg by mouth daily.     No current facility-administered medications on file prior to visit.    Allergies  Allergen Reactions  . Hydrocodone Other (See Comments)    Skin turns red      Review of Systems:  ( Completed via adult medical history intake form today ) General:  Denies fever, chills, appetite changes, unexplained weight loss.  Respiratory: Denies SOB, DOE, cough, wheezing.  Cardiovascular: Denies chest pain, palpitations.  Gastrointestinal: Denies nausea, vomiting, diarrhea, abdominal pain.  Genitourinary: Denies dysuria, increased frequency, flank pain. Endocrine: Denies hot or cold intolerance, polyuria, polydipsia. Musculoskeletal: Denies myalgias, back pain, joint swelling, arthralgias, gait problems.  Skin: Denies pallor, rash, suspicious lesions.  Neurological: Denies dizziness, seizures, syncope, unexplained weakness, lightheadedness, numbness and headaches.  Psychiatric/Behavioral: Denies mood changes, suicidal or homicidal ideations, hallucinations, sleep disturbances.    Objective:    Blood pressure 129/81, pulse 66, height '6\' 8"'$  (2.032 m), weight 375 lb 4.8 oz (170.235 kg). Body mass index is 41.23 kg/(m^2). General: Well Developed, well nourished, and in no acute distress.  HEENT: Normocephalic, atraumatic, pupils equal round reactive to light, neck supple, No carotid bruits no JVD Skin: Warm and dry, cap RF less 2 sec Cardiac: Regular  rate and rhythm, S1, S2 WNL's, no murmurs rubs or gallops Respiratory: ECTA B/L, Not using accessory muscles, speaking in full sentences. NeuroM-Sk: Ambulates w/o assistance, moves ext * 4 w/o difficulty, sensation grossly intact.  Psych: A and O *3, judgement and insight good.       Recent Results (from the past 2160 hour(s))  CBC with Differential/Platelet     Status: None   Collection Time: 10/08/15  9:10 AM  Result Value Ref Range   WBC 7.2 3.8 - 10.8 K/uL   RBC 4.59 4.20 - 5.80 MIL/uL   Hemoglobin 14.3  13.2 - 17.1 g/dL   HCT 42.9 38.5 - 50.0 %   MCV 93.5 80.0 - 100.0 fL   MCH 31.2 27.0 - 33.0 pg   MCHC 33.3 32.0 - 36.0 g/dL   RDW 13.0 11.0 - 15.0 %   Platelets 197 140 - 400 K/uL   MPV 11.9 7.5 - 12.5 fL   Neutro Abs 4968 1500 - 7800 cells/uL   Lymphs Abs 1656 850 - 3900 cells/uL   Monocytes Absolute 432 200 - 950 cells/uL   Eosinophils Absolute 144 15 - 500 cells/uL   Basophils Absolute 0 0 - 200 cells/uL   Neutrophils Relative % 69 %   Lymphocytes Relative 23 %   Monocytes Relative 6 %   Eosinophils Relative 2 %   Basophils Relative 0 %   Smear Review Criteria for review not met     Comment: ** Please note change in unit of measure and reference range(s). **  COMPLETE METABOLIC PANEL WITH GFR     Status: Abnormal   Collection Time: 10/08/15  9:10 AM  Result Value Ref Range   Sodium 141 135 - 146 mmol/L   Potassium 4.2 3.5 - 5.3 mmol/L   Chloride 106 98 - 110 mmol/L   CO2 26 20 - 31 mmol/L   Glucose, Bld 110 (H) 65 - 99 mg/dL   BUN 17 7 - 25 mg/dL   Creat 0.80 0.60 - 1.35 mg/dL   Total Bilirubin 0.6 0.2 - 1.2 mg/dL   Alkaline Phosphatase 63 40 - 115 U/L   AST 17 10 - 40 U/L   ALT 20 9 - 46 U/L   Total Protein 6.8 6.1 - 8.1 g/dL   Albumin 4.3 3.6 - 5.1 g/dL   Calcium 9.0 8.6 - 10.3 mg/dL   GFR, Est African American >89 >=60 mL/min   GFR, Est Non African American >89 >=60 mL/min  Hemoglobin A1c     Status: Abnormal   Collection Time: 10/08/15  9:10 AM  Result Value Ref Range   Hgb A1c MFr Bld 5.8 (H) <5.7 %    Comment:   For someone without known diabetes, a hemoglobin A1c value between 5.7% and 6.4% is consistent with prediabetes and should be confirmed with a follow-up test.   For someone with known diabetes, a value <7% indicates that their diabetes is well controlled. A1c targets should be individualized based on duration of diabetes, age, co-morbid conditions and other considerations.   This assay result is consistent with an increased risk of diabetes.     Currently, no consensus exists regarding use of hemoglobin A1c for diagnosis of diabetes in children.      Mean Plasma Glucose 120 mg/dL  Lipid panel     Status: Abnormal   Collection Time: 10/08/15  9:10 AM  Result Value Ref Range   Cholesterol 136 125 - 200 mg/dL   Triglycerides 72 <150 mg/dL  HDL 38 (L) >=40 mg/dL   Total CHOL/HDL Ratio 3.6 <=5.0 Ratio   VLDL 14 <30 mg/dL   LDL Cholesterol 84 <130 mg/dL    Comment:   Total Cholesterol/HDL Ratio:CHD Risk                        Coronary Heart Disease Risk Table                                        Men       Women          1/2 Average Risk              3.4        3.3              Average Risk              5.0        4.4           2X Average Risk              9.6        7.1           3X Average Risk             23.4       11.0 Use the calculated Patient Ratio above and the CHD Risk table  to determine the patient's CHD Risk.   TSH     Status: None   Collection Time: 10/08/15  9:10 AM  Result Value Ref Range   TSH 1.08 0.40 - 4.50 mIU/L  VITAMIN D 25 Hydroxy (Vit-D Deficiency, Fractures)     Status: None   Collection Time: 10/08/15  9:10 AM  Result Value Ref Range   Vit D, 25-Hydroxy 31 30 - 100 ng/mL    Comment: Vitamin D Status           25-OH Vitamin D        Deficiency                <20 ng/mL        Insufficiency         20 - 29 ng/mL        Optimal             > or = 30 ng/mL   For 25-OH Vitamin D testing on patients on D2-supplementation and patients for whom quantitation of D2 and D3 fractions is required, the QuestAssureD 25-OH VIT D, (D2,D3), LC/MS/MS is recommended: order code [id number] (patients > 2 yrs).         Impression and Recommendations:    The patient was counselled, risk factors were discussed, anticipatory guidance given.  Handouts and detailed explanation of all labs given to husband and wife.  Prediabetes Extensive counseling done. Advised to use the lucid. Cut back on fats and especially  carbohydrates. Eat more fiber. Exercise to a goal of 30 minutes daily. Recheck 4-6 months.  Mixed hyperlipidemia I explained we could decrease his cholesterol med in half and recheck in 6 months but that was declined by patient's wife. Recommend decrease saturated and Transfats and increase exercise to increase his low HDL.  Recheck 6 months.  Low HDL (under 40) Counseling done  Essential hypertension Continue meds. Counseling done.  Prefer blood pressure to be 119/79 or less at  patient's age.  However he is at goal of under 140/90.  Weight loss, low salt diet, exercise, and other extensive counseling performed today.  Vitamin D insufficiency Increase supplement to 5000 international units daily as level is normal at 31 but in the low normal range. Recheck 6 months with increased dose D3.    No orders of the defined types were placed in this encounter.    Please see AVS handed out to patient at the end of our visit for further patient instructions/ counseling done pertaining to today's office visit.  Gross side effects, risk and benefits, and alternatives of medications discussed with patient.  Patient is aware that all medications have potential side effects and we are unable to predict every sideeffect or drug-drug interaction that may occur.  Expresses verbal understanding and consents to current therapy plan and treatment regiment.  Note: This document was prepared using Dragon voice recognition software and may include unintentional dictation errors.

## 2015-10-15 NOTE — Assessment & Plan Note (Signed)
Increase supplement to 5000 international units daily as level is normal at 31 but in the low normal range. Recheck 6 months with increased dose D3.

## 2015-10-15 NOTE — Assessment & Plan Note (Signed)
I explained we could decrease his cholesterol med in half and recheck in 6 months but that was declined by patient's wife. Recommend decrease saturated and Transfats and increase exercise to increase his low HDL.  Recheck 6 months.

## 2015-10-15 NOTE — Patient Instructions (Signed)
Please read over the handouts and information I provided on your labs.  Exercising to Lose Weight Exercising can help you to lose weight. In order to lose weight through exercise, you need to do vigorous-intensity exercise. You can tell that you are exercising with vigorous intensity if you are breathing very hard and fast and cannot hold a conversation while exercising. Moderate-intensity exercise helps to maintain your current weight. You can tell that you are exercising at a moderate level if you have a higher heart rate and faster breathing, but you are still able to hold a conversation. HOW OFTEN SHOULD I EXERCISE? Choose an activity that you enjoy and set realistic goals. Your health care provider can help you to make an activity plan that works for you. Exercise regularly as directed by your health care provider. This may include:  Doing resistance training twice each week, such as:  Push-ups.  Sit-ups.  Lifting weights.  Using resistance bands.  Doing a given intensity of exercise for a given amount of time. Choose from these options:  150 minutes of moderate-intensity exercise every week.  75 minutes of vigorous-intensity exercise every week.  A mix of moderate-intensity and vigorous-intensity exercise every week. Children, pregnant women, people who are out of shape, people who are overweight, and older adults may need to consult a health care provider for individual recommendations. If you have any sort of medical condition, be sure to consult your health care provider before starting a new exercise program. WHAT ARE SOME ACTIVITIES THAT CAN HELP ME TO LOSE WEIGHT?   Walking at a rate of at least 4.5 miles an hour.  Jogging or running at a rate of 5 miles per hour.  Biking at a rate of at least 10 miles per hour.  Lap swimming.  Roller-skating or in-line skating.  Cross-country skiing.  Vigorous competitive sports, such as football, basketball, and  soccer.  Jumping rope.  Aerobic dancing. HOW CAN I BE MORE ACTIVE IN MY DAY-TO-DAY ACTIVITIES?  Use the stairs instead of the elevator.  Take a walk during your lunch break.  If you drive, park your car farther away from work or school.  If you take public transportation, get off one stop early and walk the rest of the way.  Make all of your phone calls while standing up and walking around.  Get up, stretch, and walk around every 30 minutes throughout the day. WHAT GUIDELINES SHOULD I FOLLOW WHILE EXERCISING?  Do not exercise so much that you hurt yourself, feel dizzy, or get very short of breath.  Consult your health care provider prior to starting a new exercise program.  Wear comfortable clothes and shoes with good support.  Drink plenty of water while you exercise to prevent dehydration or heat stroke. Body water is lost during exercise and must be replaced.  Work out until you breathe faster and your heart beats faster.   This information is not intended to replace advice given to you by your health care provider. Make sure you discuss any questions you have with your health care provider.   Document Released: 04/19/2010 Document Revised: 04/07/2014 Document Reviewed: 08/18/2013 Elsevier Interactive Patient Education [employer].

## 2015-10-26 ENCOUNTER — Encounter: Payer: Self-pay | Admitting: Family Medicine

## 2015-10-26 ENCOUNTER — Ambulatory Visit (INDEPENDENT_AMBULATORY_CARE_PROVIDER_SITE_OTHER): Payer: BLUE CROSS/BLUE SHIELD | Admitting: Family Medicine

## 2015-10-26 VITALS — BP 142/78 | HR 74 | Wt 368.9 lb

## 2015-10-26 DIAGNOSIS — R05 Cough: Secondary | ICD-10-CM

## 2015-10-26 DIAGNOSIS — B349 Viral infection, unspecified: Secondary | ICD-10-CM

## 2015-10-26 DIAGNOSIS — J028 Acute pharyngitis due to other specified organisms: Secondary | ICD-10-CM | POA: Diagnosis not present

## 2015-10-26 DIAGNOSIS — R059 Cough, unspecified: Secondary | ICD-10-CM

## 2015-10-26 MED ORDER — AMOXICILLIN-POT CLAVULANATE 875-125 MG PO TABS: 1.0000 | ORAL_TABLET | Freq: Two times a day (BID) | ORAL | 0 refills | Status: AC

## 2015-10-26 MED ORDER — PREDNISONE 20 MG PO TABS: ORAL_TABLET | ORAL | 0 refills | Status: DC

## 2015-10-26 MED ORDER — METHYLPREDNISOLONE ACETATE 80 MG/ML IJ SUSP
80.0000 mg | Freq: Once | INTRAMUSCULAR | Status: AC
  Administered 2015-10-26: 80 mg via INTRAMUSCULAR

## 2015-10-26 NOTE — Progress Notes (Signed)
Assessment and plan:  1. Acute viral syndrome   2. Cough   3. Acute pharyngitis due to other specified organisms      Depo-Medrol 80 mg IM 1 in the office.    Prednisone 80 mg daily 5 days next    Antibiotics only to be used if no improvement or worse by day 10 or he gets one-sided face pain.  - Viral vs Allergic vs Bacterial causes for pt's symptoms reveiwed.    - Supportive care and various OTC medications discussed in addition to any prescribed. - Call or RTC if new symptoms, or if no improvement or worse over next couple days.   - Will consider ABX at that time if sx continue past 7-10 days and worsening.     Patient's Medications  New Prescriptions   AMOXICILLIN-CLAVULANATE (AUGMENTIN) 875-125 MG TABLET    Take 1 tablet by mouth 2 (two) times daily.   PREDNISONE (DELTASONE) 20 MG TABLET    Take 4 tabs daily 5 days.  Previous Medications   ASPIRIN 81 MG TABLET    Take 81 mg by mouth daily.   ATORVASTATIN (LIPITOR) 20 MG TABLET    Take 1 tablet (20 mg total) by mouth daily.   CHOLECALCIFEROL (VITAMIN D3) 5000 UNITS CAPS    Take 1 capsule by mouth daily.   HYDROCHLOROTHIAZIDE (MICROZIDE) 12.5 MG CAPSULE    Take 1 capsule (12.5 mg total) by mouth daily.   LISINOPRIL (PRINIVIL,ZESTRIL) 10 MG TABLET    TAKE 1 TABLET (10 MG TOTAL) BY MOUTH DAILY.   LORATADINE (CLARITIN) 10 MG TABLET    Take 10 mg by mouth daily.  Modified Medications   No medications on file  Discontinued Medications   No medications on file   Allergies  Allergen Reactions  . Hydrocodone Other (See Comments)    Skin turns red   Anticipatory guidance and routine counseling done re: condition, txmnt options and need for follow up. All questions of patient's were answered.   Gross side effects, risk and benefits, and alternatives of medications discussed with patient.  Patient is aware that all medications have potential side effects and we are unable to predict every sideeffect or drug-drug  interaction that may occur.  Expresses verbal understanding and consents to current therapy plan and treatment regiment.  Return if symptoms worsen or fail to improve.  Please see AVS handed out to patient at the end of our visit for additional patient instructions/ counseling done pertaining to today's office visit.  Note: This document was prepared using Dragon voice recognition software and may include unintentional dictation errors.    Subjective:    CC: cold  HPI:  Pt presents with URI sx for 7 days.    C/o rhinorrhea, ST.  Then moved to goop in eye, naasal congestion and coughing later on- prod.  Low energy.  r ear fullness  Denies objective F/C, No face pain or ear pain, No N/V/D, No SOB/DIB/ chest congested feeeling.  Worse past couple days.    Sudafed-  taken anything for sx.  Cough syrup- codeine in it.     Patient Active Problem List   Diagnosis Date Noted  . Prediabetes 10/15/2015  . Low HDL (under 40) 10/15/2015  . Vitamin D insufficiency 10/15/2015  . mild occ Fatigue 10/08/2015  . Environmental and seasonal allergies 09/11/2015  . Mixed hyperlipidemia 09/11/2015  . Obesity 09/11/2015  . Callus of foot 09/11/2015  . Essential hypertension 03/08/2014    Past medical history, Surgical  history, Family history reviewed and noted below, Social history, Allergies, and Medications have been entered into the medical record, reviewed and changed as needed.   Allergies  Allergen Reactions  . Hydrocodone Other (See Comments)    Skin turns red    Review of Systems:  No fever/ chills, night sweats, no unintended weight loss, No chest pain, or increased shortness of breath. Pertinent positives and negatives noted in HPI above    Objective:   BP (!) 142/78 (BP Location: Right Arm, Patient Position: Sitting, Cuff Size: Large)   Pulse 74   Wt (!) 368 lb 14.4 oz (167.3 kg)   BMI 40.53 kg/m  Blood pressure (!) 142/78, pulse 74, weight (!) 368 lb 14.4 oz (167.3 kg).  Body  mass index is 40.53 kg/m.  General: Well Developed, well nourished, appropriate for stated age.  Neuro: Alert and oriented x3, extra-ocular muscles intact, sensation grossly intact.  HEENT: Normocephalic, atraumatic, pupils equal round reactive to light, neck supple, no masses, no painful lymphadenopathy, TM's intact B/L, no acute findings. Nares- patent, clear d/c, OP- clear, mild erythema Skin: Warm and dry, no gross rash. Cardiac: RRR, S1 S2,  no murmurs rubs or gallops.  Respiratory: ECTA B/L and A/P, Not using accessory muscles, speaking in full sentences- unlabored. Vascular:  No gross lower ext edema, cap RF less 2 sec. Psych: No SI/HI, Insight and judgement good

## 2015-10-26 NOTE — Addendum Note (Signed)
Addended by: Fonnie Mu on: 10/26/2015 12:17 PM   Modules accepted: Orders

## 2015-10-26 NOTE — Patient Instructions (Addendum)
Take severe Tylenol Cold and sinus if needed.     Upper Respiratory Infection, Adult Most upper respiratory infections (URIs) are a viral infection of the air passages leading to the lungs. A URI affects the nose, throat, and upper air passages. The most common type of URI is nasopharyngitis and is typically referred to as "the common cold." URIs run their course and usually go away on their own. Most of the time, a URI does not require medical attention, but sometimes a bacterial infection in the upper airways can follow a viral infection. This is called a secondary infection. Sinus and middle ear infections are common types of secondary upper respiratory infections. Bacterial pneumonia can also complicate a URI. A URI can worsen asthma and chronic obstructive pulmonary disease (COPD). Sometimes, these complications can require emergency medical care and may be life threatening.  CAUSES Almost all URIs are caused by viruses. A virus is a type of germ and can spread from one person to another.  RISKS FACTORS You may be at risk for a URI if:   You smoke.   You have chronic heart or lung disease.  You have a weakened defense (immune) system.   You are very young or very old.   You have nasal allergies or asthma.  You work in crowded or poorly ventilated areas.  You work in health care facilities or schools. SIGNS AND SYMPTOMS  Symptoms typically develop 2-3 days after you come in contact with a cold virus. Most viral URIs last 7-10 days. However, viral URIs from the influenza virus (flu virus) can last 14-18 days and are typically more severe. Symptoms may include:   Runny or stuffy (congested) nose.   Sneezing.   Cough.   Sore throat.   Headache.   Fatigue.   Fever.   Loss of appetite.   Pain in your forehead, behind your eyes, and over your cheekbones (sinus pain).  Muscle aches.  DIAGNOSIS  Your health care provider may diagnose a URI by:  Physical  exam.  Tests to check that your symptoms are not due to another condition such as:  Strep throat.  Sinusitis.  Pneumonia.  Asthma. TREATMENT  A URI goes away on its own with time. It cannot be cured with medicines, but medicines may be prescribed or recommended to relieve symptoms. Medicines may help:  Reduce your fever.  Reduce your cough.  Relieve nasal congestion. HOME CARE INSTRUCTIONS   Take medicines only as directed by your health care provider.   Gargle warm saltwater or take cough drops to comfort your throat as directed by your health care provider.  Use a warm mist humidifier or inhale steam from a shower to increase air moisture. This may make it easier to breathe.  Drink enough fluid to keep your urine clear or pale yellow.   Eat soups and other clear broths and maintain good nutrition.   Rest as needed.   Return to work when your temperature has returned to normal or as your health care provider advises. You may need to stay home longer to avoid infecting others. You can also use a face mask and careful hand washing to prevent spread of the virus.  Increase the usage of your inhaler if you have asthma.   Do not use any tobacco products, including cigarettes, chewing tobacco, or electronic cigarettes. If you need help quitting, ask your health care provider. PREVENTION  The best way to protect yourself from getting a cold is to practice good  hygiene.   Avoid oral or hand contact with people with cold symptoms.   Wash your hands often if contact occurs.  There is no clear evidence that vitamin C, vitamin E, echinacea, or exercise reduces the chance of developing a cold. However, it is always recommended to get plenty of rest, exercise, and practice good nutrition.  SEEK MEDICAL CARE IF:   You are getting worse rather than better.   Your symptoms are not controlled by medicine.   You have chills.  You have worsening shortness of breath.  You  have brown or red mucus.  You have yellow or brown nasal discharge.  You have pain in your face, especially when you bend forward.  You have a fever.  You have swollen neck glands.  You have pain while swallowing.  You have white areas in the back of your throat. SEEK IMMEDIATE MEDICAL CARE IF:   You have severe or persistent:  Headache.  Ear pain.  Sinus pain.  Chest pain.  You have chronic lung disease and any of the following:  Wheezing.  Prolonged cough.  Coughing up blood.  A change in your usual mucus.  You have a stiff neck.  You have changes in your:  Vision.  Hearing.  Thinking.  Mood. MAKE SURE YOU:   Understand these instructions.  Will watch your condition.  Will get help right away if you are not doing well or get worse.   This information is not intended to replace advice given to you by your health care provider. Make sure you discuss any questions you have with your health care provider.   Document Released: 09/10/2000 Document Revised: 08/01/2014 Document Reviewed: 06/22/2013 Elsevier Interactive Patient Education [employer].

## 2016-04-10 ENCOUNTER — Other Ambulatory Visit: Payer: Self-pay | Admitting: Family Medicine

## 2016-04-10 DIAGNOSIS — I10 Essential (primary) hypertension: Secondary | ICD-10-CM

## 2016-04-11 ENCOUNTER — Ambulatory Visit (INDEPENDENT_AMBULATORY_CARE_PROVIDER_SITE_OTHER): Payer: BLUE CROSS/BLUE SHIELD | Admitting: Family Medicine

## 2016-04-11 DIAGNOSIS — Z5329 Procedure and treatment not carried out because of patient's decision for other reasons: Secondary | ICD-10-CM

## 2016-04-12 DIAGNOSIS — Z91199 Patient's noncompliance with other medical treatment and regimen due to unspecified reason: Secondary | ICD-10-CM | POA: Insufficient documentation

## 2016-04-12 DIAGNOSIS — Z5329 Procedure and treatment not carried out because of patient's decision for other reasons: Secondary | ICD-10-CM | POA: Insufficient documentation

## 2016-04-12 NOTE — Progress Notes (Signed)
No show

## 2016-06-11 ENCOUNTER — Other Ambulatory Visit: Payer: Self-pay | Admitting: Family Medicine

## 2016-06-11 DIAGNOSIS — I10 Essential (primary) hypertension: Secondary | ICD-10-CM

## 2016-07-08 ENCOUNTER — Other Ambulatory Visit: Payer: Self-pay

## 2016-07-08 DIAGNOSIS — I10 Essential (primary) hypertension: Secondary | ICD-10-CM

## 2016-07-08 MED ORDER — LISINOPRIL 10 MG PO TABS: ORAL_TABLET | ORAL | 0 refills | Status: DC

## 2016-07-14 ENCOUNTER — Ambulatory Visit (INDEPENDENT_AMBULATORY_CARE_PROVIDER_SITE_OTHER): Payer: BLUE CROSS/BLUE SHIELD | Admitting: Adult Health

## 2016-07-14 ENCOUNTER — Encounter: Payer: Self-pay | Admitting: Adult Health

## 2016-07-14 VITALS — BP 121/74 | HR 74 | Ht >= 80 in | Wt 367.6 lb

## 2016-07-14 DIAGNOSIS — R7303 Prediabetes: Secondary | ICD-10-CM

## 2016-07-14 DIAGNOSIS — Z6841 Body Mass Index (BMI) 40.0 and over, adult: Secondary | ICD-10-CM | POA: Diagnosis not present

## 2016-07-14 DIAGNOSIS — I10 Essential (primary) hypertension: Secondary | ICD-10-CM | POA: Diagnosis not present

## 2016-07-14 LAB — POCT GLYCOSYLATED HEMOGLOBIN (HGB A1C): Hemoglobin A1C: 5.7

## 2016-07-14 MED ORDER — HYDROCHLOROTHIAZIDE 12.5 MG PO CAPS: 12.5000 mg | ORAL_CAPSULE | Freq: Every day | ORAL | 1 refills | Status: DC

## 2016-07-14 MED ORDER — ATORVASTATIN CALCIUM 20 MG PO TABS: 20.0000 mg | ORAL_TABLET | Freq: Every day | ORAL | 1 refills | Status: DC

## 2016-07-14 MED ORDER — LISINOPRIL 10 MG PO TABS: ORAL_TABLET | ORAL | 1 refills | Status: DC

## 2016-07-14 NOTE — Assessment & Plan Note (Signed)
BP well controlled. Continue Lisinopril 10mg  and HCTZ 12.5mg  daily.

## 2016-07-14 NOTE — Progress Notes (Signed)
Subjective:    Patient ID: David Gonzalez, male    DOB: 1970/10/06, 46 y.o.   MRN: [id number]  HPI:  David Gonzalez is here for regular f/u and med refill.  He is compliant in his medications and denies SE.  He denies acute complaints today.  He reports walking a mile a day when working, however does not participate in any other exercise/regular movement.  He reports eating "fast food" 5-6 times a week.   A1c today 5.7 A1c 10/08/15 5.8  Patient Care Team    Relationship Specialty Notifications Start End  Mellody Dance, DO PCP - General Family Medicine  09/10/15     Patient Active Problem List   Diagnosis Date Noted  . No-show for appointment 04/12/2016  . Prediabetes 10/15/2015  . Low HDL (under 40) 10/15/2015  . Vitamin D insufficiency 10/15/2015  . mild occ Fatigue 10/08/2015  . Environmental and seasonal allergies 09/11/2015  . Mixed hyperlipidemia 09/11/2015  . Obesity 09/11/2015  . Callus of foot 09/11/2015  . Essential hypertension 03/08/2014     Past Medical History:  Diagnosis Date  . Complication of anesthesia    reported to be violent waking from anesthesia at age 2  . Headache(784.0)   . Mental disorder    mildly claustrophobic     Past Surgical History:  Procedure Laterality Date  . ANTERIOR CERVICAL DECOMP/DISCECTOMY FUSION  04/30/2011   Procedure: ANTERIOR CERVICAL DECOMPRESSION/DISCECTOMY FUSION 1 LEVEL;  Surgeon: Dahlia Bailiff, MD;  Location: [facility];  Service: Orthopedics;  Laterality: N/A;  ACDF C5-6  . KNEE ARTHROSCOPY    . TONSILLECTOMY       Family History  Problem Relation Age of Onset  . Diabetes Mother   . Hypertension Mother   . Arthritis Mother   . Neuropathy Mother   . Hypertension Father   . Cancer Father   . Hypertension Sister   . Hypertension Maternal Grandmother   . Schizophrenia Maternal Grandfather   . Hypertension Paternal Grandmother   . Hypertension Paternal Grandfather      History  Drug Use No     History   Alcohol Use No     History  Smoking Status  . Never Smoker  Smokeless Tobacco  . Never Used     Outpatient Encounter Prescriptions as of 07/14/2016  Medication Sig  . aspirin 81 MG tablet Take 81 mg by mouth daily.  [name] Kitchen atorvastatin (LIPITOR) 20 MG tablet Take 1 tablet (20 mg total) by mouth daily.  . Cholecalciferol (VITAMIN D3) 5000 units CAPS Take 1 capsule by mouth daily.  . hydrochlorothiazide (MICROZIDE) 12.5 MG capsule Take 1 capsule (12.5 mg total) by mouth daily.  [name] Kitchen lisinopril (PRINIVIL,ZESTRIL) 10 MG tablet TAKE 1 TABLET (10 MG TOTAL) BY MOUTH DAILY.  [name] Kitchen loratadine (CLARITIN) 10 MG tablet Take 10 mg by mouth daily.  . predniSONE (DELTASONE) 20 MG tablet Take 4 tabs daily 5 days.  . [DISCONTINUED] atorvastatin (LIPITOR) 20 MG tablet TAKE 1 TABLET (20 MG TOTAL) BY MOUTH DAILY.  . [DISCONTINUED] hydrochlorothiazide (MICROZIDE) 12.5 MG capsule Take 1 capsule (12.5 mg total) by mouth daily.  . [DISCONTINUED] lisinopril (PRINIVIL,ZESTRIL) 10 MG tablet TAKE 1 TABLET (10 MG TOTAL) BY MOUTH DAILY.   No facility-administered encounter medications on file as of 07/14/2016.     Allergies: Hydrocodone  Body mass index is 40.38 kg/m.  Blood pressure 121/74, pulse 74, height 6\' 8"  (2.032 m), weight (!) 367 lb 9.6 oz (166.7 kg).     Review  of Systems  Constitutional: Negative for activity change, appetite change, chills, diaphoresis, fatigue, fever and unexpected weight change.  HENT: Negative for congestion.   Eyes: Negative for visual disturbance.  Respiratory: Negative for cough, chest tightness, shortness of breath, wheezing and stridor.   Cardiovascular: Negative for chest pain, palpitations and leg swelling.  Gastrointestinal: Negative for abdominal distention, abdominal pain, blood in stool, constipation, diarrhea, nausea and vomiting.  Endocrine: Negative for cold intolerance, heat intolerance, polydipsia, polyphagia and polyuria.  Genitourinary: Negative for difficulty  urinating and flank pain.  Musculoskeletal: Positive for back pain. Negative for arthralgias, gait problem, joint swelling, myalgias, neck pain and neck stiffness.  Skin: Negative for color change, pallor, rash and wound.  Neurological: Negative for dizziness, tremors, weakness, light-headedness and headaches.  Hematological: Negative for adenopathy. Does not bruise/bleed easily.  Psychiatric/Behavioral: Negative for agitation, self-injury, sleep disturbance and suicidal ideas. The patient is not nervous/anxious.        Objective:   Physical Exam  Constitutional: He is oriented to person, place, and time. He appears well-developed and well-nourished. No distress.  HENT:  Head: Normocephalic and atraumatic.  Right Ear: Hearing, tympanic membrane and external ear normal. No decreased hearing is noted.  Left Ear: Hearing, tympanic membrane and external ear normal. No decreased hearing is noted.  Nose: Nose normal. Right sinus exhibits no maxillary sinus tenderness and no frontal sinus tenderness. Left sinus exhibits no maxillary sinus tenderness and no frontal sinus tenderness.  Eyes: Conjunctivae are normal. Pupils are equal, round, and reactive to light.  Neck: Normal range of motion.  Cardiovascular: Normal rate, regular rhythm, normal heart sounds and intact distal pulses.   No murmur heard. Pulmonary/Chest: Effort normal and breath sounds normal. No respiratory distress. He has no wheezes. He has no rales. He exhibits no tenderness.  Abdominal: Soft. Bowel sounds are normal. He exhibits no distension and no mass. There is no tenderness. There is no rebound and no guarding.  Musculoskeletal: Normal range of motion.  Lymphadenopathy:    He has no cervical adenopathy.  Neurological: He is oriented to person, place, and time.  Skin: Skin is warm and dry. No rash noted. He is not diaphoretic. No erythema. No pallor.  Psychiatric: He has a normal mood and affect. His behavior is normal.  Judgment and thought content normal.  Nursing note and vitals reviewed.         Assessment & Plan:   1. Prediabetes   2. Essential hypertension   3. Class 3 severe obesity due to excess calories without serious comorbidity with body mass index (BMI) of 40.0 to 44.9 in adult [facility])     Essential hypertension BP well controlled. Continue Lisinopril 10mg  and HCTZ 12.5mg  daily.  Prediabetes A1 c today 5.7, improved from 5.8 in 09/2016. Continue to reduce sugar/CHO intake. Increase regular movement.  Obesity REDUCE fast food intake to MAX 2/3 times week, currently 6 nights a week. Heart Healthy Diet. Discussed at length that this amount of weight is negatively affecting his overall health -CV, Endocrine, GI, musculoskeletal.     FOLLOW-UP:  Return in about 6 months (around 01/13/2017) for Regular Follow Up.

## 2016-07-14 NOTE — Assessment & Plan Note (Signed)
A1 c today 5.7, improved from 5.8 in 09/2016. Continue to reduce sugar/CHO intake. Increase regular movement.

## 2016-07-14 NOTE — Assessment & Plan Note (Signed)
REDUCE fast food intake to MAX 2/3 times week, currently 6 nights a week. Heart Healthy Diet. Discussed at length that this amount of weight is negatively affecting his overall health -CV, Endocrine, GI, musculoskeletal.

## 2016-07-14 NOTE — Patient Instructions (Signed)
Heart-Healthy Eating Plan Many factors influence your heart health, including eating and exercise habits. Heart (coronary) risk increases with abnormal blood fat (lipid) levels. Heart-healthy meal planning includes limiting unhealthy fats, increasing healthy fats, and making other small dietary changes. This includes maintaining a healthy body weight to help keep lipid levels within a normal range. What is my plan? Your health care provider recommends that you:  Get no more than _________% of the total calories in your daily diet from fat.  Limit your intake of saturated fat to less than _________% of your total calories each day.  Limit the amount of cholesterol in your diet to less than _________ mg per day. What types of fat should I choose?  Choose healthy fats more often. Choose monounsaturated and polyunsaturated fats, such as olive oil and canola oil, flaxseeds, walnuts, almonds, and seeds.  Eat more omega-3 fats. Good choices include salmon, mackerel, sardines, tuna, flaxseed oil, and ground flaxseeds. Aim to eat fish at least two times each week.  Limit saturated fats. Saturated fats are primarily found in animal products, such as meats, butter, and cream. Plant sources of saturated fats include palm oil, palm kernel oil, and coconut oil.  Avoid foods with partially hydrogenated oils in them. These contain trans fats. Examples of foods that contain trans fats are stick margarine, some tub margarines, cookies, crackers, and other baked goods. What general guidelines do I need to follow?  Check food labels carefully to identify foods with trans fats or high amounts of saturated fat.  Fill one half of your plate with vegetables and green salads. Eat 4-5 servings of vegetables per day. A serving of vegetables equals 1 cup of raw leafy vegetables,  cup of raw or cooked cut-up vegetables, or  cup of vegetable juice.  Fill one fourth of your plate with whole grains. Look for the word  "whole" as the first word in the ingredient list.  Fill one fourth of your plate with lean protein foods.  Eat 4-5 servings of fruit per day. A serving of fruit equals one medium whole fruit,  cup of dried fruit,  cup of fresh, frozen, or canned fruit, or  cup of 100% fruit juice.  Eat more foods that contain soluble fiber. Examples of foods that contain this type of fiber are apples, broccoli, carrots, beans, peas, and barley. Aim to get 20-30 g of fiber per day.  Eat more home-cooked food and less restaurant, buffet, and fast food.  Limit or avoid alcohol.  Limit foods that are high in starch and sugar.  Avoid fried foods.  Cook foods by using methods other than frying. Baking, boiling, grilling, and broiling are all great options. Other fat-reducing suggestions include:  Removing the skin from poultry.  Removing all visible fats from meats.  Skimming the fat off of stews, soups, and gravies before serving them.  Steaming vegetables in water or broth.  Lose weight if you are overweight. Losing just 5-10% of your initial body weight can help your overall health and prevent diseases such as diabetes and heart disease.  Increase your consumption of nuts, legumes, and seeds to 4-5 servings per week. One serving of dried beans or legumes equals  cup after being cooked, one serving of nuts equals 1 ounces, and one serving of seeds equals  ounce or 1 tablespoon.  You may need to monitor your salt (sodium) intake, especially if you have high blood pressure. Talk with your health care provider or dietitian to get more  information about reducing sodium. What foods can I eat? Grains   Breads, including [place], white, pita, wheat, raisin, rye, oatmeal, and [place]. Tortillas that are neither fried nor made with lard or trans fat. Low-fat rolls, including hotdog and hamburger buns and English muffins. Biscuits. Muffins. Waffles. Pancakes. Light popcorn. Whole-grain cereals. Flatbread.  Melba toast. Pretzels. Breadsticks. Rusks. Low-fat snacks and crackers, including oyster, saltine, matzo, graham, animal, and rye. Rice and pasta, including [place] rice and those that are made with whole wheat. Vegetables  All vegetables. Fruits  All fruits, but limit coconut. Meats and Other Protein Sources  Lean, well-trimmed beef, veal, pork, and lamb. Chicken and [place] without skin. All fish and shellfish. Wild duck, rabbit, pheasant, and venison. Egg whites or low-cholesterol egg substitutes. Dried beans, peas, lentils, and tofu.Seeds and most nuts. Dairy  Low-fat or nonfat cheeses, including ricotta, string, and mozzarella. Skim or 1% milk that is liquid, powdered, or evaporated. Buttermilk that is made with low-fat milk. Nonfat or low-fat yogurt. Beverages  Mineral water. Diet carbonated beverages. Sweets and Desserts  Sherbets and fruit ices. Honey, jam, marmalade, jelly, and syrups. Meringues and gelatins. Pure sugar candy, such as hard candy, jelly beans, gumdrops, mints, marshmallows, and small amounts of dark chocolate. [employer] Eat all sweets and desserts in moderation. Fats and Oils  Nonhydrogenated (trans-free) margarines. Vegetable oils, including soybean, sesame, sunflower, olive, peanut, safflower, corn, canola, and cottonseed. Salad dressings or mayonnaise that are made with a vegetable oil. Limit added fats and oils that you use for cooking, baking, salads, and as spreads. Other  Cocoa powder. Coffee and tea. All seasonings and condiments. The items listed above may not be a complete list of recommended foods or beverages. Contact your dietitian for more options.  What foods are not recommended? Grains  Breads that are made with saturated or trans fats, oils, or whole milk. Croissants. Butter rolls. Cheese breads. Sweet rolls. Donuts. Buttered popcorn. Chow mein noodles. High-fat crackers, such as cheese or butter crackers. Meats and Other Protein Sources  Fatty  meats, such as hotdogs, short ribs, sausage, spareribs, bacon, ribeye roast or steak, and mutton. High-fat deli meats, such as salami and bologna. Caviar. Domestic duck and goose. Organ meats, such as kidney, liver, sweetbreads, brains, gizzard, chitterlings, and heart. Dairy  Cream, sour cream, cream cheese, and creamed cottage cheese. Whole milk cheeses, including blue (bleu), Monterey Jack, [place], [place], American, [place], Swiss, [place], [place], and [place]. Whole or 2% milk that is liquid, evaporated, or condensed. Whole buttermilk. Cream sauce or high-fat cheese sauce. Yogurt that is made from whole milk. Beverages  Regular sodas and drinks with added sugar. Sweets and Desserts  Frosting. Pudding. Cookies. Cakes other than angel food cake. Candy that has milk chocolate or white chocolate, hydrogenated fat, butter, coconut, or unknown ingredients. Buttered syrups. Full-fat ice cream or ice cream drinks. Fats and Oils  Gravy that has suet, meat fat, or shortening. Cocoa butter, hydrogenated oils, palm oil, coconut oil, palm kernel oil. These can often be found in baked products, candy, fried foods, nondairy creamers, and whipped toppings. Solid fats and shortenings, including bacon fat, salt pork, lard, and butter. Nondairy cream substitutes, such as coffee creamers and sour cream substitutes. Salad dressings that are made of unknown oils, cheese, or sour cream. The items listed above may not be a complete list of foods and beverages to avoid. Contact your dietitian for more information.  This information is not intended to replace advice given to you by  your health care provider. Make sure you discuss any questions you have with your health care provider. Document Released: 12/25/2007 Document Revised: 10/05/2015 Document Reviewed: 09/08/2013 Elsevier Interactive Patient Education  2017 [place] "Fast Food" intake to no more then 2-3 times week. Increase water intake and reduce  saturated fat/sugar intake. Increase regular movement to at least 66mins 5 times a week. Med refills provided-90 day supplies provided. Follow-up in 6 months, sooner if needed.

## 2016-08-14 ENCOUNTER — Ambulatory Visit (INDEPENDENT_AMBULATORY_CARE_PROVIDER_SITE_OTHER): Payer: BLUE CROSS/BLUE SHIELD | Admitting: Family Medicine

## 2016-08-14 ENCOUNTER — Encounter: Payer: Self-pay | Admitting: Family Medicine

## 2016-08-14 VITALS — BP 120/82 | HR 72 | Temp 98.3°F | Ht >= 80 in | Wt 366.0 lb

## 2016-08-14 DIAGNOSIS — J3089 Other allergic rhinitis: Secondary | ICD-10-CM | POA: Diagnosis not present

## 2016-08-14 DIAGNOSIS — R05 Cough: Secondary | ICD-10-CM | POA: Diagnosis not present

## 2016-08-14 DIAGNOSIS — R058 Other specified cough: Secondary | ICD-10-CM

## 2016-08-14 DIAGNOSIS — J019 Acute sinusitis, unspecified: Secondary | ICD-10-CM

## 2016-08-14 MED ORDER — AMOXICILLIN-POT CLAVULANATE 875-125 MG PO TABS: 1.0000 | ORAL_TABLET | Freq: Two times a day (BID) | ORAL | 0 refills | Status: AC

## 2016-08-14 MED ORDER — PREDNISONE 20 MG PO TABS: ORAL_TABLET | ORAL | 0 refills | Status: DC

## 2016-08-14 NOTE — Progress Notes (Signed)
Acute Care Office visit  Assessment and plan:  1. Acute sinusitis, recurrence not specified, unspecified location   2. Productive cough   3. Environmental and seasonal allergies     Anticipatory guidance and routine counseling done re: condition, txmnt options and need for follow up. All questions of patient's were answered.  - Viral vs Allergic vs Bacterial causes for pt's symptoms reveiwed.    - Supportive care and various OTC medications discussed in addition to any prescribed. - Call or RTC if new symptoms, or if no improvement or worse over next several days.   - consider starting ABX if sx continue past 7-10 days and worsening despite steroids and supportive care.    Meds ordered this encounter  Medications  . amoxicillin-clavulanate (AUGMENTIN) 875-125 MG tablet    Sig: Take 1 tablet by mouth 2 (two) times daily.    Dispense:  20 tablet    Refill:  0  . predniSONE (DELTASONE) 20 MG tablet    Sig: Take 3 tabs po * 2 days, then 2 tabs for 2 d, then 1 tab 2 d, then 1/2 tab 2 days.    Dispense:  15 tablet    Refill:  0    Gross side effects, risk and benefits, and alternatives of medications discussed with patient.  Patient is aware that all medications have potential side effects and we are unable to predict every sideeffect or drug-drug interaction that may occur.  Expresses verbal understanding and consents to current therapy plan and treatment regiment.  Return for Follow-up of current medical issues.  Please see AVS handed out to patient at the end of our visit for additional patient instructions/ counseling done pertaining to today's office visit.  Note: This document was prepared using Dragon voice recognition software and may include unintentional dictation errors.    Subjective:    Chief Complaint  Patient presents with  . Nasal Congestion    started monday  . URI    HPI:  Pt presents with URI sx for 4 days.      C/o rhinorrhea, ST, HA and now moved  in to chest and having prod cough.  Subjective fevers. No pleurtic pain, still with head congestion  Been taking sudafed for his congestion and does not feel any plapitations/ cp, sob, no heart racing etc.   Denies objective F/C, No face pain or ear pain, No N/V/D but no appetite, No SOB/DIB/ wh, No Rash.    Overall getting W.     Patient Active Problem List   Diagnosis Date Noted  . No-show for appointment 04/12/2016  . Prediabetes 10/15/2015  . Low HDL (under 40) 10/15/2015  . Vitamin D insufficiency 10/15/2015  . mild occ Fatigue 10/08/2015  . Environmental and seasonal allergies 09/11/2015  . Mixed hyperlipidemia 09/11/2015  . Obesity 09/11/2015  . Callus of foot 09/11/2015  . Essential hypertension 03/08/2014    Past medical history, Surgical history, Family history reviewed and noted below, Social history, Allergies, and Medications have been entered into the medical record, reviewed and changed as needed.   Allergies  Allergen Reactions  . Hydrocodone Other (See Comments)    Skin turns red    Review of Systems: General:   No F/C, wt loss Pulm:   No DIB, pleuritic chest pain Card:  No CP, palpitations Abd:  No n/v/d or pain Ext:  No inc edema from baseline   Objective:   Blood pressure 120/82, pulse 72, temperature 98.3 F (36.8 C), height  6\' 8"  (2.032 m), weight (!) 366 lb (166 kg), SpO2 98 %. Body mass index is 40.21 kg/m. General: Well Developed, well nourished, appropriate for stated age.  Neuro: Alert and oriented x3, extra-ocular muscles intact, sensation grossly intact.  HEENT: Normocephalic, atraumatic, pupils equal round reactive to light, neck supple, no masses, no painful lymphadenopathy, TM's intact B/L, no acute findings. Nares- patent, clear d/c, OP- clear, mild erythema, No TTP sinuses Skin: Warm and dry, no gross rash. Cardiac: RRR, S1 S2,  no murmurs rubs or gallops.  Respiratory: ECTA B/L and A/P, Not using accessory muscles, speaking in full  sentences- unlabored. Vascular:  No gross lower ext edema, cap RF less 2 sec. Psych: No HI/SI, judgement and insight good, Euthymic mood. Full Affect.   Patient Care Team    Relationship Specialty Notifications Start End  Mellody Dance, DO PCP - General Family Medicine  09/10/15

## 2016-08-14 NOTE — Patient Instructions (Signed)
Also, sterile saline nasal rinses, such as Milta Deiters med or AYR sinus rinses, can be very helpful and should be done twice daily- especially throughout the allergy season.   Remember you should use distilled water or previously boiled water to do this.   You can also use an over the counter cold and flu medication such as Tylenol Severe Cold and Sinus/Flu or Dayquil, Nyquil and the like, which will help with cough, congestion, headache/ pain, fevers/chills etc.  Please note, if you being treated for hypertension or have high blood pressure, you should be using the cold meds designated "HBP".    Unfortunately, antibiotics are not helpful for viral infections.  Wash your hands frequently, as you did not want to get those around you sick as well. Never sneeze or cough on others.  And you should not be going to school or work if you are running a temperature of 100.5 or more on two separate occasions.   Drink plenty of fluids and stay hydrated, especially if you are running fevers.  We don't know why, but chicken soup also helps, try it! :)

## 2016-09-14 ENCOUNTER — Other Ambulatory Visit: Payer: Self-pay | Admitting: Family Medicine

## 2016-09-14 DIAGNOSIS — I10 Essential (primary) hypertension: Secondary | ICD-10-CM

## 2016-10-04 ENCOUNTER — Other Ambulatory Visit: Payer: Self-pay | Admitting: Family Medicine

## 2017-02-09 ENCOUNTER — Telehealth: Payer: Self-pay | Admitting: Family Medicine

## 2017-02-09 ENCOUNTER — Other Ambulatory Visit: Payer: Self-pay

## 2017-02-09 ENCOUNTER — Encounter: Payer: Self-pay | Admitting: Family Medicine

## 2017-02-09 DIAGNOSIS — I10 Essential (primary) hypertension: Secondary | ICD-10-CM

## 2017-02-09 MED ORDER — HYDROCHLOROTHIAZIDE 12.5 MG PO CAPS: 12.5000 mg | ORAL_CAPSULE | Freq: Every day | ORAL | 0 refills | Status: DC

## 2017-02-09 MED ORDER — LISINOPRIL 10 MG PO TABS: ORAL_TABLET | ORAL | 0 refills | Status: DC

## 2017-02-09 NOTE — Telephone Encounter (Signed)
Patient's wife called for refills on medication, patient needs a follow up with Dr. Raliegh Scarlet as he is overdue for appointment.  Refilled medication for 30 days and patient called to make appointment for additional refills. MPulliam, CMA/RT(R)

## 2017-02-09 NOTE — Telephone Encounter (Signed)
Sent in 30 day supply, patient is overdue for follow up and will need to come in for visit within 30 days.  Patient will be called to make appointment. MPulliam, CMA/RT(R)

## 2017-02-09 NOTE — Telephone Encounter (Signed)
Left pt a message to call [facility] to set up a appointment--PCP required pt to have an appointment  W/ in 30dys--glh

## 2017-02-09 NOTE — Telephone Encounter (Signed)
Patient's wife called states [facility] should have sent Dr. Raliegh Scarlet Rx refill request for (2) meds:  Hydrochlorothiazide 12.5 MG capsules( takes 1 daily). & Lisinopril/ Prinivil 10 MG tablets.  --Ph[facility]/ [facility] in [place], [place]  ---Pt is out of both meds per wife and needs them refill today.  --glh

## 2017-03-06 ENCOUNTER — Other Ambulatory Visit: Payer: Self-pay

## 2017-03-06 DIAGNOSIS — I10 Essential (primary) hypertension: Secondary | ICD-10-CM

## 2017-03-06 MED ORDER — LISINOPRIL 10 MG PO TABS: ORAL_TABLET | ORAL | 0 refills | Status: DC

## 2017-03-06 MED ORDER — HYDROCHLOROTHIAZIDE 12.5 MG PO CAPS: 12.5000 mg | ORAL_CAPSULE | Freq: Every day | ORAL | 0 refills | Status: DC

## 2017-04-05 ENCOUNTER — Other Ambulatory Visit: Payer: Self-pay | Admitting: Family Medicine

## 2017-04-05 DIAGNOSIS — I10 Essential (primary) hypertension: Secondary | ICD-10-CM

## 2017-04-20 ENCOUNTER — Ambulatory Visit: Payer: BLUE CROSS/BLUE SHIELD | Admitting: Family Medicine

## 2017-04-25 ENCOUNTER — Other Ambulatory Visit: Payer: Self-pay | Admitting: Family Medicine

## 2017-04-25 DIAGNOSIS — I10 Essential (primary) hypertension: Secondary | ICD-10-CM

## 2017-05-11 ENCOUNTER — Encounter: Payer: Self-pay | Admitting: Family Medicine

## 2017-05-11 ENCOUNTER — Ambulatory Visit (INDEPENDENT_AMBULATORY_CARE_PROVIDER_SITE_OTHER): Payer: BLUE CROSS/BLUE SHIELD | Admitting: Family Medicine

## 2017-05-11 VITALS — BP 139/75 | HR 67 | Ht >= 80 in | Wt 358.7 lb

## 2017-05-11 DIAGNOSIS — E782 Mixed hyperlipidemia: Secondary | ICD-10-CM | POA: Diagnosis not present

## 2017-05-11 DIAGNOSIS — R7303 Prediabetes: Secondary | ICD-10-CM | POA: Diagnosis not present

## 2017-05-11 DIAGNOSIS — I10 Essential (primary) hypertension: Secondary | ICD-10-CM | POA: Diagnosis not present

## 2017-05-11 DIAGNOSIS — E786 Lipoprotein deficiency: Secondary | ICD-10-CM

## 2017-05-11 DIAGNOSIS — R5383 Other fatigue: Secondary | ICD-10-CM

## 2017-05-11 DIAGNOSIS — Z6841 Body Mass Index (BMI) 40.0 and over, adult: Secondary | ICD-10-CM

## 2017-05-11 DIAGNOSIS — E559 Vitamin D deficiency, unspecified: Secondary | ICD-10-CM | POA: Diagnosis not present

## 2017-05-11 MED ORDER — ATORVASTATIN CALCIUM 20 MG PO TABS: 20.0000 mg | ORAL_TABLET | Freq: Every day | ORAL | 1 refills | Status: DC

## 2017-05-11 MED ORDER — LISINOPRIL 20 MG PO TABS: 10.0000 mg | ORAL_TABLET | Freq: Every day | ORAL | 1 refills | Status: DC

## 2017-05-11 MED ORDER — HYDROCHLOROTHIAZIDE 12.5 MG PO CAPS: ORAL_CAPSULE | ORAL | 1 refills | Status: DC

## 2017-05-11 NOTE — Patient Instructions (Addendum)
We are going from 10 mg of lisinopril to 20 mg lisinopril to hopefully keep blood pressures consistently less than or equal to 130/80.  Please try to check it at home on occasion or if you go out to the grocery store\ pharmacy etc.    Hypertension Hypertension, commonly called high blood pressure, is when the force of blood pumping through the arteries is too strong. The arteries are the blood vessels that carry blood from the heart throughout the body. Hypertension forces the heart to work harder to pump blood and may cause arteries to become narrow or stiff. Having untreated or uncontrolled hypertension can cause heart attacks, strokes, kidney disease, and other problems. A blood pressure reading consists of a higher number over a lower number. Ideally, your blood pressure should be below 120/80. The first ("top") number is called the systolic pressure. It is a measure of the pressure in your arteries as your heart beats. The second ("bottom") number is called the diastolic pressure. It is a measure of the pressure in your arteries as the heart relaxes. What are the causes? The cause of this condition is not known. What increases the risk? Some risk factors for high blood pressure are under your control. Others are not. Factors you can change  Smoking.  Having type 2 diabetes mellitus, high cholesterol, or both.  Not getting enough exercise or physical activity.  Being overweight.  Having too much fat, sugar, calories, or salt (sodium) in your diet.  Drinking too much alcohol. Factors that are difficult or impossible to change  Having chronic kidney disease.  Having a family history of high blood pressure.  Age. Risk increases with age.  Race. You may be at higher risk if you are African-American.  Gender. Men are at higher risk than women before age 65. After age 58, women are at higher risk than men.  Having obstructive sleep apnea.  Stress. What are the signs or  symptoms? Extremely high blood pressure (hypertensive crisis) may cause:  Headache.  Anxiety.  Shortness of breath.  Nosebleed.  Nausea and vomiting.  Severe chest pain.  Jerky movements you cannot control (seizures).  How is this diagnosed? This condition is diagnosed by measuring your blood pressure while you are seated, with your arm resting on a surface. The cuff of the blood pressure monitor will be placed directly against the skin of your upper arm at the level of your heart. It should be measured at least twice using the same arm. Certain conditions can cause a difference in blood pressure between your right and left arms. Certain factors can cause blood pressure readings to be lower or higher than normal (elevated) for a short period of time:  When your blood pressure is higher when you are in a health care provider's office than when you are at home, this is called white coat hypertension. Most people with this condition do not need medicines.  When your blood pressure is higher at home than when you are in a health care provider's office, this is called masked hypertension. Most people with this condition may need medicines to control blood pressure.  If you have a high blood pressure reading during one visit or you have normal blood pressure with other risk factors:  You may be asked to return on a different day to have your blood pressure checked again.  You may be asked to monitor your blood pressure at home for 1 week or longer.  If you are diagnosed with  hypertension, you may have other blood or imaging tests to help your health care provider understand your overall risk for other conditions. How is this treated? This condition is treated by making healthy lifestyle changes, such as eating healthy foods, exercising more, and reducing your alcohol intake. Your health care provider may prescribe medicine if lifestyle changes are not enough to get your blood pressure  under control, and if:  Your systolic blood pressure is above 130.  Your diastolic blood pressure is above 80.  Your personal target blood pressure may vary depending on your medical conditions, your age, and other factors. Follow these instructions at home: Eating and drinking  Eat a diet that is high in fiber and potassium, and low in sodium, added sugar, and fat. An example eating plan is called the DASH (Dietary Approaches to Stop Hypertension) diet. To eat this way: ? Eat plenty of fresh fruits and vegetables. Try to fill half of your plate at each meal with fruits and vegetables. ? Eat whole grains, such as whole wheat pasta, brown rice, or whole grain bread. Fill about one quarter of your plate with whole grains. ? Eat or drink low-fat dairy products, such as skim milk or low-fat yogurt. ? Avoid fatty cuts of meat, processed or cured meats, and poultry with skin. Fill about one quarter of your plate with lean proteins, such as fish, chicken without skin, beans, eggs, and tofu. ? Avoid premade and processed foods. These tend to be higher in sodium, added sugar, and fat.  Reduce your daily sodium intake. Most people with hypertension should eat less than 1,500 mg of sodium a day.  Limit alcohol intake to no more than 1 drink a day for nonpregnant women and 2 drinks a day for men. One drink equals 12 oz of beer, 5 oz of wine, or 1 oz of hard liquor. Lifestyle  Work with your health care provider to maintain a healthy body weight or to lose weight. Ask what an ideal weight is for you.  Get at least 30 minutes of exercise that causes your heart to beat faster (aerobic exercise) most days of the week. Activities may include walking, swimming, or biking.  Include exercise to strengthen your muscles (resistance exercise), such as pilates or lifting weights, as part of your weekly exercise routine. Try to do these types of exercises for 30 minutes at least 3 days a week.  Do not use any  products that contain nicotine or tobacco, such as cigarettes and e-cigarettes. If you need help quitting, ask your health care provider.  Monitor your blood pressure at home as told by your health care provider.  Keep all follow-up visits as told by your health care provider. This is important. Medicines  Take over-the-counter and prescription medicines only as told by your health care provider. Follow directions carefully. Blood pressure medicines must be taken as prescribed.  Do not skip doses of blood pressure medicine. Doing this puts you at risk for problems and can make the medicine less effective.  Ask your health care provider about side effects or reactions to medicines that you should watch for. Contact a health care provider if:  You think you are having a reaction to a medicine you are taking.  You have headaches that keep coming back (recurring).  You feel dizzy.  You have swelling in your ankles.  You have trouble with your vision. Get help right away if:  You develop a severe headache or confusion.  You have  unusual weakness or numbness.  You feel faint.  You have severe pain in your chest or abdomen.  You vomit repeatedly.  You have trouble breathing. Summary  Hypertension is when the force of blood pumping through your arteries is too strong. If this condition is not controlled, it may put you at risk for serious complications.  Your personal target blood pressure may vary depending on your medical conditions, your age, and other factors. For most people, a normal blood pressure is less than 120/80.  Hypertension is treated with lifestyle changes, medicines, or a combination of both. Lifestyle changes include weight loss, eating a healthy, low-sodium diet, exercising more, and limiting alcohol. This information is not intended to replace advice given to you by your health care provider. Make sure you discuss any questions you have with your health care  provider. Document Released: 03/17/2005 Document Revised: 02/13/2016 Document Reviewed: 02/13/2016 Elsevier Interactive Patient Education  2018 [place] out the DASH diet = 1.5 Gram Low Sodium Diet   A 1.5 gram sodium diet restricts the amount of sodium in the diet to no more than 1.5 g or 1500 mg daily.  The American Heart Association recommends Americans over the age of 59 to consume no more than 1500 mg of sodium each day to reduce the risk of developing high blood pressure.  Research also shows that limiting sodium may reduce heart attack and stroke risk.  Many foods contain sodium for flavor and sometimes as a preservative.  When the amount of sodium in a diet needs to be low, it is important to know what to look for when choosing foods and drinks.  The following includes some information and guidelines to help make it easier for you to adapt to a low sodium diet.    QUICK TIPS  Do not add salt to food.  Avoid convenience items and fast food.  Choose unsalted snack foods.  Buy lower sodium products, often labeled as "lower sodium" or "no salt added."  Check food labels to learn how much sodium is in 1 serving.  When eating at a restaurant, ask that your food be prepared with less salt or none, if possible.    READING FOOD LABELS FOR SODIUM INFORMATION  The nutrition facts label is a good place to find how much sodium is in foods. Look for products with no more than 400 mg of sodium per serving.  Remember that 1.5 g = 1500 mg.  The food label may also list foods as:  Sodium-free: Less than 5 mg in a serving.  Very low sodium: 35 mg or less in a serving.  Low-sodium: 140 mg or less in a serving.  Light in sodium: 50% less sodium in a serving. For example, if a food that usually has 300 mg of sodium is changed to become light in sodium, it will have 150 mg of sodium.  Reduced sodium: 25% less sodium in a serving. For example, if a food that usually has 400 mg of sodium  is changed to reduced sodium, it will have 300 mg of sodium.    CHOOSING FOODS  Grains  Avoid: Salted crackers and snack items. Some cereals, including instant hot cereals. Bread stuffing and biscuit mixes. Seasoned rice or pasta mixes.  Choose: Unsalted snack items. Low-sodium cereals, oats, puffed wheat and rice, shredded wheat. English muffins and bread. Pasta.  Meats  Avoid: Salted, canned, smoked, spiced, pickled meats, including fish and poultry. Bacon, ham, sausage,  cold cuts, hot dogs, anchovies.  Choose: Low-sodium canned tuna and salmon. Fresh or frozen meat, poultry, and fish.  Dairy  Avoid: Processed cheese and spreads. Cottage cheese. Buttermilk and condensed milk. Regular cheese.  Choose: Milk. Low-sodium cottage cheese. Yogurt. Sour cream. Low-sodium cheese.  Fruits and Vegetables  Avoid: Regular canned vegetables. Regular canned tomato sauce and paste. Frozen vegetables in sauces. Olives. Angie Fava. Relishes. Sauerkraut.  Choose: Low-sodium canned vegetables. Low-sodium tomato sauce and paste. Frozen or fresh vegetables. Fresh and frozen fruit.  Condiments  Avoid: Canned and packaged gravies. Worcestershire sauce. Tartar sauce. Barbecue sauce. Soy sauce. Steak sauce. Ketchup. Onion, garlic, and table salt. Meat flavorings and tenderizers.  Choose: Fresh and dried herbs and spices. Low-sodium varieties of mustard and ketchup. Lemon juice. Tabasco sauce. Horseradish.    SAMPLE 1.5 GRAM SODIUM MEAL PLAN:   Breakfast / Sodium (mg)  1 cup low-fat milk / 143 mg  1 whole-wheat English muffin / 240 mg  1 tbs heart-healthy margarine / 153 mg  1 hard-boiled egg / 139 mg  1 small orange / 0 mg  Lunch / Sodium (mg)  1 cup raw carrots / 76 mg  2 tbs no salt added peanut butter / 5 mg  2 slices whole-wheat bread / 270 mg  1 tbs jelly / 6 mg   cup red grapes / 2 mg  Dinner / Sodium (mg)  1 cup whole-wheat pasta / 2 mg  1 cup low-sodium tomato sauce / 73 mg  3 oz lean ground beef  / 57 mg  1 small side salad (1 cup raw spinach leaves,  cup cucumber,  cup yellow bell pepper) with 1 tsp olive oil and 1 tsp red wine vinegar / 25 mg  Snack / Sodium (mg)  1 container low-fat vanilla yogurt / 107 mg  3 graham cracker squares / 127 mg  Nutrient Analysis  Calories: 1745  Protein: 75 g  Carbohydrate: 237 g  Fat: 57 g  Sodium: 1425 mg  Document Released: 03/17/2005 Document Revised: 11/27/2010 Document Reviewed: 06/18/2009  [facility] Patient Information 2012 [place], [place].

## 2017-05-11 NOTE — Progress Notes (Signed)
Impression and Recommendations:    1. Prediabetes   2. Essential hypertension   3. Vitamin D insufficiency   4. Class 3 severe obesity due to excess calories without serious comorbidity with body mass index (BMI) of 40.0 to 44.9 in adult ([place])   5. Mixed hyperlipidemia   6. Low HDL (under 40)   7. Fatigue, unspecified type      1. Prediabetes- A1c was 5.8 from 10-08-15. A1c was 5.7 on 07-14-16.  2. HTN- Pt is compliant with his BP medications at home. To keep his BP at home consistently below 130/80, we will increase from 10mg  lisinopril to 20mg . Recommended pt to check his BP occasionally at home.  3. Vitamin D insufficiency- Pt is compliant with his supplements and tolerating them well. Continue as prescribed.  4. Class 3 severe obesity- recommended pt to continue losing weight and watching what he eats.   5. HLD- Continue taking your medications as directed, before sleep.   6. Low HDL- dietary and exercise guidelines discussed. Continue meds as prescribed.  7. Fatigue- continue supplements as prescribed.  -Order FBW today. Follow up in 6 months for follow up of chronic care and health management.  -Follow up in 3 months for CPE.  Gross side effects, risk and benefits, and alternatives of medications and treatment plan in general discussed with patient.  Patient is aware that all medications have potential side effects and we are unable to predict every side effect or drug-drug interaction that may occur.   Patient will call with any questions prior to using medication if they have concerns.  Expresses verbal understanding and consents to current therapy and treatment regimen.  No barriers to understanding were identified.  Red flag symptoms and signs discussed in detail.  Patient expressed understanding regarding what to do in case of emergency\urgent symptoms  Please see AVS handed out to patient at the end of our visit for further patient instructions/ counseling done  pertaining to today's office visit.   Return for CPE/ yrly physical in 3 mo, chronic f/up in 66mo for HTN, VIT D, HLD, Wt etc.    Note: This note was prepared with assistance of Systems analyst. Occasional wrong-word or sound-a-like substitutions may have occurred due to the inherent limitations of voice recognition software.  This document serves as a record of services personally performed by Mellody Dance, DO. It was created on her behalf by Mayer Masker, a trained medical scribe. The creation of this record is based on the scribe's personal observations and the provider's statements to them.   I have reviewed the above medical documentation for accuracy and completeness and I concur.  Mellody Dance 05/11/17 8:50 AM  --------------------------------------------------------------------------------------------------------------------------------------------------------------------------------------------------------------------------------------------    Subjective:     HPI: David Gonzalez is a 47 y.o. male who presents to [place] at [facility] today for issues as discussed below.  HTN HPI:  -  His blood pressure has been controlled at home. He does not check this at home.  - Patient reports good compliance with blood pressure medications. He states rarely, about once a month, he will forget to take his BP medications.   - Denies medication S-E   - Smoking Status noted   - He denies new onset of: chest pain, exercise intolerance, shortness of breath, dizziness, visual changes, headache, lower extremity swelling or claudication.   Last 3 blood pressure readings in our office are as follows: BP Readings from Last 3  Encounters:  05/11/17 139/75  08/14/16 120/82  07/14/16 121/74    Filed Weights   05/11/17 0822  Weight: (!) 358 lb 11.2 oz (162.7 kg)    HLD: He is taking his medications daily in the morning, when he gets home from  work. 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 He has not taken his medications in 1 week due to being out of this at home.  Vit D: he takes this medications daily. He takes 5000 IUs daily.  Vitamin D was 31 when last checked over a year ago  Pre-DM: A1c 07-14-16: 5.7, A1c 10-08-15: 5.8.  Weight: He has lost 12-13 lbs in 1 month. He is starting to eat one or two salads a day instead of fast food. He is not exercising.   Fatigue: He states after the first few months of taking vitamin D he said he had improvement in his energy.  He denies snoring in his sleep, he denies feeling fatigued or tired during his "daytime "since he works at night.  He also denies holding breath and has no concerns he has OSA.   Wt Readings from Last 3 Encounters:  05/11/17 (!) 358 lb 11.2 oz (162.7 kg)  08/14/16 (!) 366 lb (166 kg)  07/14/16 (!) 367 lb 9.6 oz (166.7 kg)   BP Readings from Last 3 Encounters:  05/11/17 139/75  08/14/16 120/82  07/14/16 121/74   Pulse Readings from Last 3 Encounters:  05/11/17 67  08/14/16 72  07/14/16 74   BMI Readings from Last 3 Encounters:  05/11/17 39.41 kg/m  08/14/16 40.21 kg/m  07/14/16 40.38 kg/m     Patient Care Team    Relationship Specialty Notifications Start End  Mellody Dance, DO PCP - General Family Medicine  09/10/15      Patient Active Problem List   Diagnosis Date Noted  . No-show for appointment 04/12/2016  . Prediabetes 10/15/2015  . Low HDL (under 40) 10/15/2015  . Vitamin D insufficiency 10/15/2015  . mild occ Fatigue 10/08/2015  . Environmental and seasonal allergies 09/11/2015  . Mixed hyperlipidemia 09/11/2015  . Obesity 09/11/2015  . Callus of foot 09/11/2015  . Essential hypertension 03/08/2014    Past Medical history, Surgical history, Family history, Social history, Allergies and Medications have been entered into the medical record, reviewed and changed as needed.    Current Meds  Medication Sig  . aspirin 81 MG  tablet Take 81 mg by mouth daily.  [name] Kitchen atorvastatin (LIPITOR) 20 MG tablet Take 1 tablet (20 mg total) by mouth at bedtime.  . Cholecalciferol (VITAMIN D3) 5000 units CAPS Take 1 capsule by mouth daily.  . hydrochlorothiazide (MICROZIDE) 12.5 MG capsule TAKE 1 CAPSULE BY MOUTH EVERY DAY  . lisinopril (PRINIVIL,ZESTRIL) 20 MG tablet Take 0.5 tablets (10 mg total) by mouth daily.  [name] Kitchen loratadine (CLARITIN) 10 MG tablet Take 10 mg by mouth daily.  . [DISCONTINUED] atorvastatin (LIPITOR) 20 MG tablet Take 1 tablet (20 mg total) by mouth daily.  . [DISCONTINUED] hydrochlorothiazide (MICROZIDE) 12.5 MG capsule TAKE 1 CAPSULE BY MOUTH EVERY DAY  . [DISCONTINUED] lisinopril (PRINIVIL,ZESTRIL) 10 MG tablet TAKE 1 TABLET BY MOUTH EVERY DAY    Allergies:  Allergies  Allergen Reactions  . Hydrocodone Other (See Comments)    Skin turns red     Review of Systems:  A fourteen system review of systems was performed and found to be positive as per HPI.   Objective:   Blood pressure 139/75, pulse 67, height  6\' 8"  (2.032 m), weight (!) 358 lb 11.2 oz (162.7 kg), SpO2 98 %. Body mass index is 39.41 kg/m. General:  Well Developed, well nourished, appropriate for stated age.  Neuro:  Alert and oriented,  extra-ocular muscles intact  HEENT:  Normocephalic, atraumatic, neck supple, no carotid bruits appreciated  Skin:  no gross rash, warm, pink. Cardiac:  RRR, S1 S2 Respiratory:  ECTA B/L and A/P, Not using accessory muscles, speaking in full sentences- unlabored. Vascular:  Ext warm, no cyanosis apprec.; cap RF less 2 sec. Psych:  No HI/SI, judgement and insight good, Euthymic mood. Full Affect.

## 2017-05-12 ENCOUNTER — Telehealth: Payer: Self-pay | Admitting: Family Medicine

## 2017-05-12 LAB — COMPREHENSIVE METABOLIC PANEL
ALBUMIN: 4.5 g/dL (ref 3.5–5.5)
ALT: 21 IU/L (ref 0–44)
AST: 16 IU/L (ref 0–40)
Albumin/Globulin Ratio: 1.7 (ref 1.2–2.2)
Alkaline Phosphatase: 70 IU/L (ref 39–117)
BUN / CREAT RATIO: 17 (ref 9–20)
BUN: 17 mg/dL (ref 6–24)
Bilirubin Total: 0.6 mg/dL (ref 0.0–1.2)
CALCIUM: 9.6 mg/dL (ref 8.7–10.2)
CO2: 24 mmol/L (ref 20–29)
CREATININE: 1.03 mg/dL (ref 0.76–1.27)
Chloride: 102 mmol/L (ref 96–106)
GFR calc non Af Amer: 87 mL/min/{1.73_m2} (ref >59)
GFR, EST AFRICAN AMERICAN: 100 mL/min/{1.73_m2} (ref >59)
Globulin, Total: 2.6 g/dL (ref 1.5–4.5)
Glucose: 110 mg/dL — ABNORMAL HIGH (ref 65–99)
Potassium: 4.7 mmol/L (ref 3.5–5.2)
Sodium: 142 mmol/L (ref 134–144)
TOTAL PROTEIN: 7.1 g/dL (ref 6.0–8.5)

## 2017-05-12 LAB — LIPID PANEL
Chol/HDL Ratio: 4.6 ratio (ref 0.0–5.0)
Cholesterol, Total: 146 mg/dL (ref 100–199)
HDL: 32 mg/dL — AB (ref >39)
LDL Calculated: 88 mg/dL (ref 0–99)
TRIGLYCERIDES: 131 mg/dL (ref 0–149)
VLDL Cholesterol Cal: 26 mg/dL (ref 5–40)

## 2017-05-12 LAB — HEMOGLOBIN A1C
ESTIMATED AVERAGE GLUCOSE: 111 mg/dL
Hgb A1c MFr Bld: 5.5 % (ref 4.8–5.6)

## 2017-05-12 LAB — CBC WITH DIFFERENTIAL/PLATELET
BASOS: 1 %
Basophils Absolute: 0 10*3/uL (ref 0.0–0.2)
EOS (ABSOLUTE): 0.1 10*3/uL (ref 0.0–0.4)
Eos: 2 %
HEMOGLOBIN: 13.8 g/dL (ref 13.0–17.7)
Hematocrit: 39.6 % (ref 37.5–51.0)
IMMATURE GRANS (ABS): 0 10*3/uL (ref 0.0–0.1)
Immature Granulocytes: 0 %
LYMPHS: 19 %
Lymphocytes Absolute: 1.2 10*3/uL (ref 0.7–3.1)
MCH: 32.1 pg (ref 26.6–33.0)
MCHC: 34.8 g/dL (ref 31.5–35.7)
MCV: 92 fL (ref 79–97)
MONOCYTES: 7 %
Monocytes Absolute: 0.5 10*3/uL (ref 0.1–0.9)
NEUTROS ABS: 4.4 10*3/uL (ref 1.4–7.0)
Neutrophils: 71 %
Platelets: 209 10*3/uL (ref 150–379)
RBC: 4.3 x10E6/uL (ref 4.14–5.80)
RDW: 12.9 % (ref 12.3–15.4)
WBC: 6.2 10*3/uL (ref 3.4–10.8)

## 2017-05-12 LAB — VITAMIN D 25 HYDROXY (VIT D DEFICIENCY, FRACTURES): Vit D, 25-Hydroxy: 42.6 ng/mL (ref 30.0–100.0)

## 2017-05-12 NOTE — Telephone Encounter (Signed)
Called patient back left message for patient to call back. MPulliam, CMA/RT(R)

## 2017-05-12 NOTE — Telephone Encounter (Signed)
Patient wants to talk to nurse about his his lisinopril, he was under the impression that his dosage would increase after yesterdays visit however when he went to the pharmacy the dosage was the exact same. He wants to make sure he understood correctly or if there was an error somewhere.

## 2017-05-13 ENCOUNTER — Other Ambulatory Visit: Payer: Self-pay

## 2017-05-13 DIAGNOSIS — I10 Essential (primary) hypertension: Secondary | ICD-10-CM

## 2017-05-13 MED ORDER — LISINOPRIL 20 MG PO TABS: 20.0000 mg | ORAL_TABLET | Freq: Every day | ORAL | 1 refills | Status: DC

## 2017-05-13 NOTE — Telephone Encounter (Signed)
Patient called states that Lisinopril was sent over with same dosage but was increased to 1 tablet daily.  Resent in correct RX with updated directions per Dr. Hershal Coria note and instructions. MPulliam, CMA/RT(R)

## 2017-05-13 NOTE — Telephone Encounter (Signed)
Spoke to patient, new RX sent into pharmacy. MPulliam, CMA/RT(R)

## 2017-05-16 LAB — TSH: TSH: 1.03 u[IU]/mL (ref 0.450–4.500)

## 2017-05-16 LAB — SPECIMEN STATUS REPORT

## 2017-06-03 ENCOUNTER — Ambulatory Visit: Payer: BLUE CROSS/BLUE SHIELD | Admitting: Family Medicine

## 2017-08-17 ENCOUNTER — Ambulatory Visit (INDEPENDENT_AMBULATORY_CARE_PROVIDER_SITE_OTHER): Payer: BLUE CROSS/BLUE SHIELD | Admitting: Family Medicine

## 2017-08-17 ENCOUNTER — Encounter: Payer: Self-pay | Admitting: Family Medicine

## 2017-08-17 VITALS — BP 131/76 | HR 80 | Ht >= 80 in | Wt 356.0 lb

## 2017-08-17 DIAGNOSIS — Z0000 Encounter for general adult medical examination without abnormal findings: Secondary | ICD-10-CM

## 2017-08-17 DIAGNOSIS — Z719 Counseling, unspecified: Secondary | ICD-10-CM | POA: Diagnosis not present

## 2017-08-17 NOTE — Patient Instructions (Signed)

## 2017-08-17 NOTE — Progress Notes (Signed)
Male physical  Impression and Recommendations:    1. Encounter for wellness examination   2. Health education/counseling     1) Anticipatory Guidance: Discussed importance of wearing a seatbelt while driving, not texting while driving;   sunscreen when outside along with skin surveillance; eating a balanced and modest diet; physical activity at least 25 minutes per day or 150 min/ week moderate to intense activity.  2) Immunizations / Screenings / Labs:  All immunizations are up-to-date per recommendations or will be updated today. Patient is due for dental and vision screens which pt will schedule independently. Will obtain CBC, CMP, HgA1c, Lipid panel, TSH and vit D when fasting, if not already done recently.   3) Weight:  BMI meaning discussed with patient.  Discussed goal of losing 5-10% of current body weight which would improve overall feelings of well being and improve objective health data. Improve nutrient density of diet through increasing intake of fruits and vegetables and decreasing saturated fats, white flour products and refined sugars.    Gross side effects, risk and benefits, and alternatives of medications discussed with patient.  Patient is aware that all medications have potential side effects and we are unable to predict every side effect or drug-drug interaction that may occur.  Expresses verbal understanding and consents to current therapy plan and treatment regimen.  Please see AVS handed out to patient at the end of our visit for further patient instructions/ counseling done pertaining to today's office visit.  Follow-up preventative CPE in 1 year. Follow-up office visit pending lab work.  F/up sooner for chronic care management and/or prn    Subjective:    CC: CPE  HPI: HOLLEY WIRT is a 47 y.o. male who presents to [place] at [facility] today for a yearly health maintenance exam.    Patient has no complaints today.  His review  of system is within normal limits.  -Does go to [facility] dermatology to get yearly skin screenings.  This office sits across from [facility].  They told him all of his skin lesions were benign and to come yrly for screenings  Health Maintenance Summary Reviewed and updated, unless pt declines services.  Colonoscopy:    Unnecessary secondary to < 18 or > 14 years old.  Denies any family history. CT scan for screening lung CA:   Not applicable. Abdominal Ultrasound:    Unnecessary secondary to < 49 or > 60 years old Alcohol:    No concerns, no excessive use Exercise Habits:   Not meeting AHA guidelines. STD concerns:   None- monogamous Drug Use:   None  Birth control method:   n/a Testicular/penile concerns:     No concerns.   No concerns with sexual health.   Immunization History  Administered Date(s) Administered  . Tdap 10/08/2015    Health Maintenance  Topic Date Due  . INFLUENZA VACCINE  01/11/2018 (Originally 10/29/2017)  . HIV Screening  05/11/2018 (Originally 09/07/1985)  . TETANUS/TDAP  10/07/2025    Wt Readings from Last 3 Encounters:  08/17/17 (!) 356 lb (161.5 kg)  05/11/17 (!) 358 lb 11.2 oz (162.7 kg)  08/14/16 (!) 366 lb (166 kg)   BP Readings from Last 3 Encounters:  08/17/17 131/76  05/11/17 139/75  08/14/16 120/82   Pulse Readings from Last 3 Encounters:  08/17/17 80  05/11/17 67  08/14/16 72    Patient Active Problem List   Diagnosis Date Noted  . No-show for appointment  04/12/2016  . Prediabetes 10/15/2015  . Low HDL (under 40) 10/15/2015  . Vitamin D insufficiency 10/15/2015  . mild occ Fatigue 10/08/2015  . Environmental and seasonal allergies 09/11/2015  . Mixed hyperlipidemia 09/11/2015  . Obesity 09/11/2015  . Callus of foot 09/11/2015  . Essential hypertension 03/08/2014    Past Medical History:  Diagnosis Date  . Complication of anesthesia    reported to be violent waking from anesthesia at age 54  . Headache(784.0)   . Mental  disorder    mildly claustrophobic    Past Surgical History:  Procedure Laterality Date  . ANTERIOR CERVICAL DECOMP/DISCECTOMY FUSION  04/30/2011   Procedure: ANTERIOR CERVICAL DECOMPRESSION/DISCECTOMY FUSION 1 LEVEL;  Surgeon: Dahlia Bailiff, MD;  Location: [place];  Service: Orthopedics;  Laterality: N/A;  ACDF C5-6  . KNEE ARTHROSCOPY    . TONSILLECTOMY      Family History  Problem Relation Age of Onset  . Diabetes Mother   . Hypertension Mother   . Arthritis Mother   . Neuropathy Mother   . Hypertension Father   . Cancer Father   . Hypertension Sister   . Hypertension Maternal Grandmother   . Schizophrenia Maternal Grandfather   . Hypertension Paternal Grandmother   . Hypertension Paternal Grandfather     Social History   Substance and Sexual Activity  Drug Use No  ,  Social History   Substance and Sexual Activity  Alcohol Use No  ,  Social History   Tobacco Use  Smoking Status Never Smoker  Smokeless Tobacco Never Used  ,  Social History   Substance and Sexual Activity  Sexual Activity Yes  . Birth control/protection: Pill    Patient's Medications  New Prescriptions   No medications on file  Previous Medications   ASPIRIN 81 MG TABLET    Take 81 mg by mouth daily.   ATORVASTATIN (LIPITOR) 20 MG TABLET    Take 1 tablet (20 mg total) by mouth at bedtime.   CHOLECALCIFEROL (VITAMIN D3) 5000 UNITS CAPS    Take 1 capsule by mouth daily.   HYDROCHLOROTHIAZIDE (MICROZIDE) 12.5 MG CAPSULE    TAKE 1 CAPSULE BY MOUTH EVERY DAY   LISINOPRIL (PRINIVIL,ZESTRIL) 20 MG TABLET    Take 1 tablet (20 mg total) by mouth daily.   LORATADINE (CLARITIN) 10 MG TABLET    Take 10 mg by mouth daily.  Modified Medications   No medications on file  Discontinued Medications   No medications on file    Hydrocodone  Review of Systems: General:   Denies fever, chills, unexplained weight loss.  Optho/Auditory:   Denies visual changes, blurred vision/LOV Respiratory:   Denies  SOB, DOE more than baseline levels.  Cardiovascular:   Denies chest pain, palpitations, new onset peripheral edema  Gastrointestinal:   Denies nausea, vomiting, diarrhea.  Genitourinary: Denies dysuria, freq/ urgency, flank pain or discharge from genitals.  Endocrine:     Denies hot or cold intolerance, polyuria, polydipsia. Musculoskeletal:   Denies unexplained myalgias, joint swelling, unexplained arthralgias, gait problems.  Skin:  Denies rash, suspicious lesions Neurological:     Denies dizziness, unexplained weakness, numbness  Psychiatric/Behavioral:   Denies mood changes, suicidal or homicidal ideations, hallucinations    Objective:     Blood pressure 131/76, pulse 80, height 6\' 8"  (2.032 m), weight (!) 356 lb (161.5 kg), SpO2 98 %. Body mass index is 39.11 kg/m. General Appearance:    Alert, cooperative, no distress, appears stated age  Head:  Normocephalic, without obvious abnormality, atraumatic  Eyes:    PERRL, conjunctiva/corneas clear, EOM's intact, fundi    benign, both eyes  Ears:    Normal TM's and external ear canals, both ears  Nose:   Nares normal, septum midline, mucosa normal, no drainage    or sinus tenderness  Throat:   Lips w/o lesion, mucosa moist, and tongue normal; teeth and   gums normal  Neck:   Supple, symmetrical, trachea midline, no adenopathy;    thyroid:  no enlargement/tenderness/nodules; no carotid   bruit or JVD  Back:     Symmetric, no curvature, ROM normal, no CVA tenderness  Lungs:     Clear to auscultation bilaterally, respirations unlabored, no Wh/ R/ R  Chest Wall:    No tenderness or gross deformity; normal excursion   Heart:    Regular rate and rhythm, S1 and S2 normal, no murmur, rub   or gallop  Abdomen:     Soft, non-tender, bowel sounds active all four quadrants, NO G/R/R, no masses, no organomegaly  Genitalia:    Ext genitalia: without lesion, no penile rash or discharge, no hernias appreciated   Rectal:    Normal tone, one  small external hemorrhoid present- nonthrombosed nontender; prostate WNL's and equal b/l, no tenderness; guaiac negative stool  Extremities:   Extremities normal, atraumatic, no cyanosis or gross edema  Pulses:   2+ and symmetric all extremities  Skin:   Warm, dry, Skin color, texture, turgor normal, no obvious rashes or lesions  M-Sk:   Ambulates * 4 w/o difficulty, no gross deformities, tone WNL  Neurologic:   CNII-XII intact, normal strength, sensation and reflexes    Throughout Psych:  No HI/SI, judgement and insight good, Euthymic mood. Full Affect.

## 2017-09-04 ENCOUNTER — Encounter: Payer: Self-pay | Admitting: Family Medicine

## 2017-09-04 ENCOUNTER — Ambulatory Visit: Payer: BLUE CROSS/BLUE SHIELD | Admitting: Family Medicine

## 2017-09-04 VITALS — BP 129/76 | HR 74 | Ht >= 80 in | Wt 360.0 lb

## 2017-09-04 DIAGNOSIS — H6980 Other specified disorders of Eustachian tube, unspecified ear: Secondary | ICD-10-CM | POA: Insufficient documentation

## 2017-09-04 DIAGNOSIS — J3089 Other allergic rhinitis: Secondary | ICD-10-CM | POA: Diagnosis not present

## 2017-09-04 DIAGNOSIS — H6990 Unspecified Eustachian tube disorder, unspecified ear: Secondary | ICD-10-CM | POA: Insufficient documentation

## 2017-09-04 DIAGNOSIS — J329 Chronic sinusitis, unspecified: Secondary | ICD-10-CM | POA: Diagnosis not present

## 2017-09-04 DIAGNOSIS — H9193 Unspecified hearing loss, bilateral: Secondary | ICD-10-CM | POA: Diagnosis not present

## 2017-09-04 MED ORDER — METHYLPREDNISOLONE SODIUM SUCC 125 MG IJ SOLR
125.0000 mg | Freq: Once | INTRAMUSCULAR | Status: AC
  Administered 2017-09-04: 125 mg via INTRAMUSCULAR

## 2017-09-04 MED ORDER — PREDNISONE 20 MG PO TABS: ORAL_TABLET | ORAL | 0 refills | Status: DC

## 2017-09-04 NOTE — Progress Notes (Signed)
Acute Care Office visit  Assessment and plan:  1. Chronic sinusitis, unspecified location   2. Dysfunction of Eustachian tube, unspecified laterality   3. 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 Environmental and seasonal allergies     1 chronic sinusitis 2. Dysfunction of eustachian tube 3. Hearing disorder 4. Env/seasonal allergies  -shot of prednisone given today- pt agreed. -start prednisone taper.   -Use otc decongestants   -use AYR or Neil Med sinus rinses qd. Do not use flonase.   -Keep your appt with ENT next week.  - Viral vs Allergic vs Bacterial causes for pt's symptoms reveiwed.    - Supportive care and various OTC medications discussed in addition to any prescribed. - Call or RTC if new symptoms, or if no improvement or worse over next several days.   - Will consider ABX if sx continue past 10 days and worsening if not already given.    Meds ordered this encounter  Medications  . methylPREDNISolone sodium succinate (SOLU-MEDROL) 125 mg/2 mL injection 125 mg  . predniSONE (DELTASONE) 20 MG tablet    Sig: Take 3 pills a day for 2 days, 2 pills a day for 2 days, 1 pill a day for 2 days then one half pill a day for 2 days then off    Dispense:  14 tablet    Refill:  0    No orders of the defined types were placed in this encounter.   Gross side effects, risk and benefits, and alternatives of medications discussed with patient.  Patient is aware that all medications have potential side effects and we are unable to predict every sideeffect or drug-drug interaction that may occur.  Expresses verbal understanding and consents to current therapy plan and treatment regiment.   Education and routine counseling performed. Handouts provided.  Anticipatory guidance and routine counseling done re: condition, txmnt options and need for follow up. All questions of patient's were answered.  Return if symptoms worsen or fail to improve, for  Follow-up for chronic issues as previously discussed..  Please see AVS handed out to patient at the end of our visit for additional patient instructions/ counseling done pertaining to today's office visit.  Note: This document was partially repared using Dragon voice recognition software and may include unintentional dictation errors.   This document serves as a record of services personally performed by Mellody Dance, DO. It was created on her behalf by Mayer Masker, a trained medical scribe. The creation of this record is based on the scribe's personal observations and the provider's statements to them.   I have reviewed the above medical documentation for accuracy and completeness and I concur.  Mellody Dance 09/06/17 10:17 PM    Subjective:    Chief Complaint  Patient presents with  . Sinusitis    HPI:  Pt presents with Sx for 7 days   C/o: bilateral ear pain, head congestion and pressure, hearing changes "people sound like robots", chronic sinus problems, chest congestion  Denies: cough, dizziness, fever, chills, vertigo, room-spinning    For symptoms patient has tried: OTC decongestants without relief.   Overall getting:   worse   Patient Care Team    Relationship Specialty Notifications Start End  Mellody Dance, DO PCP - General Family Medicine  09/10/15   Dermatology, [facility]    08/17/17    Comment: Goes for yearly skin screenings    Past medical history, Surgical history, Family history reviewed and  noted below, Social history, Allergies, and Medications have been entered into the medical record, reviewed and changed as needed.   Allergies  Allergen Reactions  . Hydrocodone Other (See Comments)    Skin turns red    Review of Systems: - see above HPI for pertinent positives General:   No F/C, wt loss Pulm:   No DIB, pleuritic chest pain Card:  No CP, palpitations Abd:  No n/v/d or pain Ext:  No inc edema from baseline   Objective:   Blood  pressure 129/76, pulse 74, height 6\' 8"  (2.032 m), weight (!) 360 lb (163.3 kg), SpO2 99 %. Body mass index is 39.55 kg/m. General: Well Developed, well nourished, appropriate for stated age.  Neuro: Alert and oriented x3, extra-ocular muscles intact, sensation grossly intact.  HEENT: Normocephalic, atraumatic, pupils equal round reactive to light, neck supple, no masses, no painful lymphadenopathy,   Ears: Light reflexes intact both side, effusion present behind TM's bilaterally but L>R TM    Nares- patent, clear d/c, OP- clear, mild erythema, No TTP sinuses Skin: Warm and dry, no gross rash. Cardiac: RRR, S1 S2,  no murmurs rubs or gallops.  Respiratory: ECTA B/L and A/P, Not using accessory muscles, speaking in full sentences- unlabored. Vascular:  No gross lower ext edema, cap RF less 2 sec. Psych: No HI/SI, judgement and insight good, Euthymic mood. Full Affect.

## 2017-09-04 NOTE — Patient Instructions (Signed)
  Also, sterile saline nasal rinses, such as Milta Deiters med or AYR sinus rinses, can be very helpful and should be done twice daily- especially throughout the allergy season.   Remember you should use distilled water or previously boiled water to do this.   You can do this in addition to taking any Allegra or Claritin or Zyrtec etc. that you may be taking daily.  If your eyes tend to get an itchy or irritated feeling when your seasonal allergies get bad, you can use Naphcon-A over-the-counter eyedrops as needed  -Please follow-up with your nose and throat in the near future for further evaluation of your hearing disorder.  You need to be evaluated to see if it is due to the chronic fluid in your ears due to sinuses or if there is something else going on.  Thank you.

## 2017-09-09 ENCOUNTER — Telehealth: Payer: Self-pay | Admitting: Family Medicine

## 2017-09-09 ENCOUNTER — Other Ambulatory Visit: Payer: Self-pay

## 2017-09-09 DIAGNOSIS — Z20828 Contact with and (suspected) exposure to other viral communicable diseases: Secondary | ICD-10-CM

## 2017-09-09 MED ORDER — OSELTAMIVIR PHOSPHATE 75 MG PO CAPS: 75.0000 mg | ORAL_CAPSULE | Freq: Every day | ORAL | 1 refills | Status: DC

## 2017-09-09 NOTE — Progress Notes (Signed)
Patient has been exposed to the Flu through his wife.  She was diagnosed at an urgent care today.  Chart and labs reviewed and medication was sent in as based on office policy. MPulliam, CMA/RT(R)

## 2017-09-09 NOTE — Telephone Encounter (Signed)
Carollee Leitz pt's wife called to say she was Dx with the Flu on 09/09/17 at the [facility] (provider there has advised patient to get on Tamilflu as soon as possible.)  ---Forwarding message to medical assistant to send Rx to :   Preferred Pharmacies      [id number], [place] [phone] (Phone) [phone] (Fax)   --glh

## 2017-09-09 NOTE — Telephone Encounter (Signed)
Medication sent to pharmacy, patient notified. MPulliam, CMA/RT(R)

## 2017-09-15 ENCOUNTER — Other Ambulatory Visit: Payer: Self-pay

## 2017-09-15 ENCOUNTER — Telehealth: Payer: Self-pay | Admitting: Family Medicine

## 2017-09-15 DIAGNOSIS — J329 Chronic sinusitis, unspecified: Secondary | ICD-10-CM

## 2017-09-15 DIAGNOSIS — H6980 Other specified disorders of Eustachian tube, unspecified ear: Secondary | ICD-10-CM

## 2017-09-15 NOTE — Telephone Encounter (Signed)
Referral has been placed. MPulliam, CMA/RT(R)

## 2017-09-15 NOTE — Progress Notes (Signed)
Patient requests referral to ENT for chronic sinus issues and continued ear pain.  Patient already has appointment but needs a referral.  Referral place per Dr. Raliegh Scarlet. MPulliam, CMA/RT(R)

## 2017-09-15 NOTE — Telephone Encounter (Signed)
Patient is scheduled at [facility] with Dr. Hildred Laser on Thursday 09/17/17 for continued ear pain that we have seen him for a few weeks ago. Need referral placed in Epic to sen to their office please.

## 2017-11-06 ENCOUNTER — Other Ambulatory Visit: Payer: Self-pay | Admitting: Family Medicine

## 2017-11-06 DIAGNOSIS — I10 Essential (primary) hypertension: Secondary | ICD-10-CM

## 2017-11-06 DIAGNOSIS — E782 Mixed hyperlipidemia: Secondary | ICD-10-CM

## 2017-11-09 ENCOUNTER — Ambulatory Visit: Payer: BLUE CROSS/BLUE SHIELD | Admitting: Family Medicine

## 2017-12-24 ENCOUNTER — Other Ambulatory Visit: Payer: Self-pay | Admitting: Family Medicine

## 2017-12-24 DIAGNOSIS — I10 Essential (primary) hypertension: Secondary | ICD-10-CM

## 2018-02-22 ENCOUNTER — Encounter: Payer: Self-pay | Admitting: Family Medicine

## 2018-02-22 ENCOUNTER — Ambulatory Visit (INDEPENDENT_AMBULATORY_CARE_PROVIDER_SITE_OTHER): Payer: BLUE CROSS/BLUE SHIELD | Admitting: Family Medicine

## 2018-02-22 DIAGNOSIS — E786 Lipoprotein deficiency: Secondary | ICD-10-CM

## 2018-02-22 DIAGNOSIS — E782 Mixed hyperlipidemia: Secondary | ICD-10-CM

## 2018-02-22 DIAGNOSIS — H6980 Other specified disorders of Eustachian tube, unspecified ear: Secondary | ICD-10-CM

## 2018-02-22 DIAGNOSIS — J329 Chronic sinusitis, unspecified: Secondary | ICD-10-CM

## 2018-02-22 DIAGNOSIS — J3089 Other allergic rhinitis: Secondary | ICD-10-CM

## 2018-02-22 DIAGNOSIS — I10 Essential (primary) hypertension: Secondary | ICD-10-CM

## 2018-02-22 DIAGNOSIS — H6990 Unspecified Eustachian tube disorder, unspecified ear: Secondary | ICD-10-CM

## 2018-02-22 DIAGNOSIS — H9311 Tinnitus, right ear: Secondary | ICD-10-CM | POA: Insufficient documentation

## 2018-02-22 NOTE — Patient Instructions (Signed)

## 2018-02-22 NOTE — Progress Notes (Signed)
Impression and Recommendations:    1. Morbid obesity ([place])   2. Essential hypertension   3. Mixed hyperlipidemia   4. Low HDL (under 40)   5. Chronic sinusitis, unspecified location   6. Dysfunction of Eustachian tube, unspecified laterality   7. Environmental and seasonal allergies   8. Tinnitus of right ear     1. Chronic Sinusitis & Tinnitus, Eustachian Tube Dysfunction, Allergies - Advised the patient to begin using AYR or Neilmed sinus rinses BID followed by flonase BID (one spray to each nostril).  Advised that the patient may also incorporate allegra or claritin PRN.   - Referral to Allergist placed today.  2. Hypertension - BP controlled at this time. - Continue treatment plan as prescribed.  See med list below. - Patient tolerating meds well without complication.  Denies S-E.  - Patient knows to watch his blood pressure to see that it remains within goal.  - Lifestyle changes such as dash diet and engaging in a regular exercise program discussed with patient.  Educational handouts provided.  - Ambulatory BP monitoring encouraged.  Keep log and bring in next OV.  3. Hyperlipidemia - Currently managed on statins. - Continue treatment plan as prescribed.  See med list below. - Patient tolerating meds well without complication.  Denies S-E  - Dietary changes such as low saturated & trans fat and low carb/ ketogenic diets discussed with patient.  Encouraged regular exercise and weight loss when appropriate.   - Educational handouts provided at patient's desire.  4. BMI Counseling - BMI of 41.3 Explained to patient what BMI refers to, and what it means medically.    Told patient to think about it as a "medical risk stratification measurement" and how increasing BMI is associated with increasing risk/ or worsening state of various diseases such as hypertension, hyperlipidemia, diabetes, premature OA, depression etc.  American Heart Association guidelines for  healthy diet, basically Mediterranean diet, and exercise guidelines of 30 minutes 5 days per week or more discussed in detail.  Health counseling performed.  All questions answered.  5. Lifestyle & Preventative Health Maintenance - Advised patient to continue working toward exercising to improve overall mental, physical, and emotional health.    - Reviewed the "spokes of the wheel" of mood and health management.  Stressed the importance of ongoing prudent habits, including regular exercise, appropriate sleep hygiene, healthful dietary habits, and prayer/meditation to relax.  - Encouraged patient to engage in daily physical activity, especially a formal exercise routine.  Recommended that the patient eventually strive for at least 150 minutes of moderate cardiovascular activity per week according to guidelines established by the [facility].   - Healthy dietary habits encouraged, including low-carb, and high amounts of lean protein in diet.   - Patient should also consume adequate amounts of water.  6. Follow-Up - Prescriptions refilled today. - Re-check fasting lab work as recommended. - Otherwise, continue to return for CPE and chronic follow-up as scheduled.   - Patient knows to call in sooner if desired to address acute concerns.   Medications Discontinued During This Encounter  Medication Reason  . predniSONE (DELTASONE) 20 MG tablet Completed Course  . oseltamivir (TAMIFLU) 75 MG capsule Completed Course  . loratadine (CLARITIN) 10 MG tablet Change in therapy     Gross side effects, risk and benefits, and alternatives of medications and treatment plan in general discussed with patient.  Patient is aware that all medications have potential side effects and we are  unable to predict every side effect or drug-drug interaction that may occur.   Patient will call with any questions prior to using medication if they have concerns.    Expresses verbal understanding and consents to current  therapy and treatment regimen.  No barriers to understanding were identified.  Red flag symptoms and signs discussed in detail.  Patient expressed understanding regarding what to do in case of emergency\urgent symptoms  Please see AVS handed out to patient at the end of our visit for further patient instructions/ counseling done pertaining to today's office visit.   Return for Follow-up 4 months for cholesterol, blood pressure-sooner if poorly controlled.     Note:  This note was prepared with assistance of Dragon voice recognition software. Occasional wrong-word or sound-a-like substitutions may have occurred due to the inherent limitations of voice recognition software.   This document serves as a record of services personally performed by Mellody Dance, DO. It was created on her behalf by Toni Amend, a trained medical scribe. The creation of this record is based on the scribe's personal observations and the provider's statements to them.   I have reviewed the above medical documentation for accuracy and completeness and I concur.  Mellody Dance, DO 02/22/2018 9:30 PM       -----------------------------------------------------------------------------------------------------------------------------    Subjective:     HPI: David Gonzalez is a 47 y.o. male who presents to [place] at [facility] today for issues as discussed below.  Patient continues working as a Forensic scientist.  States good-naturedly that his kids are still "driving him crazy."  His wife is good.  He and his wife just bought some land and are about to build a house.  States that his wife's brother is Nutritional therapist, and her other brother is clearing the land.  Patient has been struggling with tinnitus all year, ever since his sinus complaints earlier this year.  He switched from Claritin to Zyrtec.  He is not using Flonase.  Patient has a deviated septum and gets nosebleeds  easily.  Patient is up 16 lbs since last visit.  Weighed 360 lbs on 09/04/2017, and 375 lbs today.  He sleeps in a recliner due to his back pain.  1. HTN HPI:  -  His blood pressure has been controlled at home.  Pt is checking it at home.  Says blood pressure seems fine.  Notes at home it runs typically high 120's, low 130's, with 70's, low 80's on the bottom.  - Patient reports good compliance with blood pressure medications.  - Denies medication S-E   - Smoking Status noted   - He denies new onset of: chest pain, exercise intolerance, shortness of breath, dizziness, visual changes, headache, lower extremity swelling or claudication.   Last 3 blood pressure readings in our office are as follows: BP Readings from Last 3 Encounters:  02/22/18 119/78  09/04/17 129/76  08/17/17 131/76    Filed Weights   02/22/18 1349  Weight: (!) 375 lb 12.8 oz (170.5 kg)    2. 47 y.o. male here for cholesterol follow-up.   - Patient reports good compliance with medications or treatment plan  - Denies medication S-E   - Smoking Status noted   - He denies new onset of: chest pain, exercise intolerance, shortness of breath, dizziness, visual changes, headache, lower extremity swelling or claudication.   The cholesterol last visit was:  Lab Results  Component Value Date   CHOL 146 05/11/2017  HDL 32 (L) 05/11/2017   LDLCALC 88 05/11/2017   TRIG 131 05/11/2017   CHOLHDL 4.6 05/11/2017    Hepatic Function Latest Ref Rng & Units 05/11/2017 10/08/2015 01/22/2015  Total Protein 6.0 - 8.5 g/dL 7.1 6.8 7.1  Albumin 3.5 - 5.5 g/dL 4.5 4.3 4.2  AST 0 - 40 IU/L _0 ALT 0 - 44 IU/L _1 Alk Phosphatase 39 - 117 IU/L 70 63 58  Total Bilirubin 0.0 - 1.2 mg/dL 0.6 0.6 0.5     Wt Readings from Last 3 Encounters:  02/22/18 (!) 375 lb 12.8 oz (170.5 kg)  09/04/17 (!) 360 lb (163.3 kg)  08/17/17 (!) 356 lb (161.5 kg)   BP Readings from Last 3 Encounters:  02/22/18 119/78  09/04/17  129/76  08/17/17 131/76   Pulse Readings from Last 3 Encounters:  02/22/18 77  09/04/17 74  08/17/17 80   BMI Readings from Last 3 Encounters:  02/22/18 41.28 kg/m  09/04/17 39.55 kg/m  08/17/17 39.11 kg/m     Patient Care Team    Relationship Specialty Notifications Start End  Mellody Dance, DO PCP - General Family Medicine  09/10/15   Dermatology, [facility]    08/17/17    Comment: Goes for yearly skin screenings     Patient Active Problem List   Diagnosis Date Noted  . Prediabetes 10/15/2015    Priority: High  . Low HDL (under 40) 10/15/2015    Priority: High  . Mixed hyperlipidemia 09/11/2015    Priority: High  . Morbid obesity ([place]) 09/11/2015    Priority: High  . Essential hypertension 03/08/2014    Priority: High  . Tinnitus of right ear 02/22/2018    Priority: Medium  . Chronic sinusitis 09/04/2017    Priority: Low  . Hearing disorder of both ears 09/04/2017  . Dysfunction of eustachian tube 09/04/2017  . No-show for appointment 04/12/2016  . Vitamin D insufficiency 10/15/2015  . mild occ Fatigue 10/08/2015  . Environmental and seasonal allergies 09/11/2015  . Callus of foot 09/11/2015    Past Medical history, Surgical history, Family history, Social history, Allergies and Medications have been entered into the medical record, reviewed and changed as needed.    Current Meds  Medication Sig  . aspirin 81 MG tablet Take 81 mg by mouth daily.  [name] Kitchen atorvastatin (LIPITOR) 20 MG tablet TAKE 1 TABLET BY MOUTH EVERYDAY AT BEDTIME  . cetirizine (ZYRTEC) 5 MG tablet Take 1 tablet by mouth daily.  . Cholecalciferol (VITAMIN D3) 5000 units CAPS Take 1 capsule by mouth daily.  . hydrochlorothiazide (MICROZIDE) 12.5 MG capsule TAKE 1 CAPSULE BY MOUTH EVERY DAY  . lisinopril (PRINIVIL,ZESTRIL) 20 MG tablet TAKE 1 TABLET BY MOUTH EVERY DAY    Allergies:  Allergies  Allergen Reactions  . Hydrocodone Other (See Comments)    Skin turns red     Review of  Systems:  A fourteen system review of systems was performed and found to be positive as per HPI.   Objective:   Blood pressure 119/78, pulse 77, temperature 98.8 F (37.1 C), height _2  (2.032 m), weight (!) 375 lb 12.8 oz (170.5 kg), SpO2 99 %. Body mass index is 41.28 kg/m. General:  Well Developed, well nourished, appropriate for stated age.  Neuro:  Alert and oriented,  extra-ocular muscles intact  HEENT:  Normocephalic, atraumatic, neck supple, no carotid bruits appreciated  Skin:  no gross rash, warm, pink. Cardiac:  RRR, S1 S2 Respiratory:  ECTA  B/L and A/P, Not using accessory muscles, speaking in full sentences- unlabored. Vascular:  Ext warm, no cyanosis apprec.; cap RF less 2 sec. Psych:  No HI/SI, judgement and insight good, Euthymic mood. Full Affect.

## 2018-04-19 ENCOUNTER — Ambulatory Visit: Payer: BLUE CROSS/BLUE SHIELD | Admitting: Allergy

## 2018-04-19 ENCOUNTER — Encounter: Payer: Self-pay | Admitting: Allergy

## 2018-04-19 VITALS — BP 116/80 | HR 78 | Temp 98.1°F | Resp 16 | Ht >= 80 in | Wt 368.0 lb

## 2018-04-19 DIAGNOSIS — H6983 Other specified disorders of Eustachian tube, bilateral: Secondary | ICD-10-CM | POA: Diagnosis not present

## 2018-04-19 DIAGNOSIS — J310 Chronic rhinitis: Secondary | ICD-10-CM | POA: Diagnosis not present

## 2018-04-19 MED ORDER — MOMETASONE FUROATE 50 MCG/ACT NA SUSP: 2.0000 | Freq: Every day | NASAL | 5 refills | Status: DC

## 2018-04-19 NOTE — Assessment & Plan Note (Signed)
Perennial rhinitis symptoms for the past 20+ years but now having worsening symptoms including ear fullness the past year. Tried Flonase which caused epistaxis. ENT diagnosed with eustachian tube dysfunction and tinnitus.  Unable to skin test today due to recent antihistamine intake.  Get bloodwork and will make additional recommendations based on results.  Try nasonex 1 spray daily and may increase to 2 sprays daily. Demonstrated proper use.   Nasal saline spray (i.e., Simply Saline) or nasal saline lavage (i.e., NeilMed) is recommended as needed and prior to medicated nasal sprays.  May use over the counter antihistamines such as Zyrtec (cetirizine), Claritin (loratadine), Allegra (fexofenadine), or Xyzal (levocetirizine) daily as needed.

## 2018-04-19 NOTE — Progress Notes (Signed)
New Patient Note  RE: David Gonzalez MRN: [id number] DOB: 1971-03-02 Date of Office Visit: 04/19/2018  Referring provider: Mellody Dance, DO Primary care provider: Mellody Dance, DO  Chief Complaint: Nasal Congestion; Dizziness; and Ear Pain  History of Present Illness: I had the pleasure of seeing David Gonzalez for initial evaluation at the [facility] on 04/19/2018. He is a 48 y.o. male, who is referred here by Mellody Dance, DO for the evaluation of nasal congestion.   He reports symptoms of nasal congestion, PND, sore throat, ear fullness, vertigo, sneezing. Symptoms have been going on for 20+ years but worse the past year. The symptoms are present all year around. Anosmia: no. Headache: yes. He has used Claritin with good improvement in symptoms. Steroid nasal sprays tend to cause nosebleeds due to his "deviated septum." Tried Flonase.  Sinus infections: no. Previous work up includes: none. Previous ENT evaluation: yes and was diagnosed with eustachian tube dysfunction and tinnitus. Previous sinus imaging: none.  Assessment and Plan: David Gonzalez is a 48 y.o. male with: Chronic rhinitis Perennial rhinitis symptoms for the past 20+ years but now having worsening symptoms including ear fullness the past year. Tried Flonase which caused epistaxis. ENT diagnosed with eustachian tube dysfunction and tinnitus.  Unable to skin test today due to recent antihistamine intake.  Get bloodwork and will make additional recommendations based on results.  Try nasonex 1 spray daily and may increase to 2 sprays daily. Demonstrated proper use.   Nasal saline spray (i.e., Simply Saline) or nasal saline lavage (i.e., NeilMed) is recommended as needed and prior to medicated nasal sprays.  May use over the counter antihistamines such as Zyrtec (cetirizine), Claritin (loratadine), Allegra (fexofenadine), or Xyzal (levocetirizine) daily as needed.  Dysfunction of eustachian  tube Recommend steroid nasal spray as above.   Return in about 2 months (around 06/18/2018).  Meds ordered this encounter  Medications  . mometasone (NASONEX) 50 MCG/ACT nasal spray    Sig: Place 2 sprays into the nose daily.    Dispense:  17 g    Refill:  5    Lab Orders     Allergens Zone 2  Other allergy screening: Asthma: no Food allergy: no Medication allergy: yes  Hydrocodone - erythema  Hymenoptera allergy: no Urticaria: no Eczema:no History of recurrent infections suggestive of immunodeficency: no  Diagnostics: Skin Testing: Deferred due to recent antihistamines use.  Past Medical History: Patient Active Problem List   Diagnosis Date Noted  . Chronic rhinitis 04/19/2018  . Tinnitus of right ear 02/22/2018  . Hearing disorder of both ears 09/04/2017  . Chronic sinusitis 09/04/2017  . Dysfunction of eustachian tube 09/04/2017  . No-show for appointment 04/12/2016  . Prediabetes 10/15/2015  . Low HDL (under 40) 10/15/2015  . Vitamin D insufficiency 10/15/2015  . mild occ Fatigue 10/08/2015  . Environmental and seasonal allergies 09/11/2015  . Mixed hyperlipidemia 09/11/2015  . Morbid obesity ([place]) 09/11/2015  . Callus of foot 09/11/2015  . Essential hypertension 03/08/2014   Past Medical History:  Diagnosis Date  . Complication of anesthesia    reported to be violent waking from anesthesia at age 54  . Headache(784.0)   . Mental disorder    mildly claustrophobic   Past Surgical History: Past Surgical History:  Procedure Laterality Date  . ANTERIOR CERVICAL DECOMP/DISCECTOMY FUSION  04/30/2011   Procedure: ANTERIOR CERVICAL DECOMPRESSION/DISCECTOMY FUSION 1 LEVEL;  Surgeon: Dahlia Bailiff, MD;  Location: [place];  Service: Orthopedics;  Laterality:  N/A;  ACDF C5-6  . KNEE ARTHROSCOPY    . TONSILLECTOMY     Medication List:  Current Outpatient Medications  Medication Sig Dispense Refill  . aspirin 81 MG tablet Take 81 mg by mouth daily.    [name] Kitchen  atorvastatin (LIPITOR) 20 MG tablet TAKE 1 TABLET BY MOUTH EVERYDAY AT BEDTIME 90 tablet 1  . Cholecalciferol (VITAMIN D3) 5000 units CAPS Take 1 capsule by mouth daily.    . hydrochlorothiazide (MICROZIDE) 12.5 MG capsule TAKE 1 CAPSULE BY MOUTH EVERY DAY 90 capsule 1  . lisinopril (PRINIVIL,ZESTRIL) 20 MG tablet TAKE 1 TABLET BY MOUTH EVERY DAY 90 tablet 1  . loratadine (CLARITIN) 10 MG tablet Take 10 mg by mouth daily.    . cetirizine (ZYRTEC) 5 MG tablet Take 1 tablet by mouth daily.    . mometasone (NASONEX) 50 MCG/ACT nasal spray Place 2 sprays into the nose daily. 17 g 5   No current facility-administered medications for this visit.    Allergies: Allergies  Allergen Reactions  . Hydrocodone Other (See Comments)    Skin turns red   Social History: Social History   Socioeconomic History  . Marital status: Married    Spouse name: Not on file  . Number of children: Not on file  . Years of education: Not on file  . Highest education level: Not on file  Occupational History  . Not on file  Social Needs  . Financial resource strain: Not on file  . Food insecurity:    Worry: Not on file    Inability: Not on file  . Transportation needs:    Medical: Not on file    Non-medical: Not on file  Tobacco Use  . Smoking status: Never Smoker  . Smokeless tobacco: Never Used  Substance and Sexual Activity  . Alcohol use: No  . Drug use: No  . Sexual activity: Yes    Birth control/protection: Pill  Lifestyle  . Physical activity:    Days per week: Not on file    Minutes per session: Not on file  . Stress: Not on file  Relationships  . Social connections:    Talks on phone: Not on file    Gets together: Not on file    Attends religious service: Not on file    Active member of club or organization: Not on file    Attends meetings of clubs or organizations: Not on file    Relationship status: Not on file  Other Topics Concern  . Not on file  Social History Narrative  . Not  on file   Lives in a 48 year old home. Smoking: denies Occupation: Corporate investment banker HistoryFreight forwarder in the house: no Charity fundraiser in the family room: no Carpet in the bedroom: no Heating: gas and electric Cooling: central Pet: yes 1 dog x 7 yrs  Family History: Family History  Problem Relation Age of Onset  . Diabetes Mother   . Hypertension Mother   . Arthritis Mother   . Neuropathy Mother   . Hypertension Father   . Cancer Father   . Hypertension Sister   . Hypertension Maternal Grandmother   . Schizophrenia Maternal Grandfather   . Hypertension Paternal Grandmother   . Hypertension Paternal Grandfather    Problem                               Relation Asthma  No  Eczema                                No  Food allergy                          No  Allergic rhino conjunctivitis     Mother   Review of Systems  Constitutional: Negative for appetite change, chills, fever and unexpected weight change.  HENT: Positive for congestion and sneezing. Negative for rhinorrhea.   Eyes: Negative for itching.  Respiratory: Negative for cough, chest tightness, shortness of breath and wheezing.   Cardiovascular: Negative for chest pain.  Gastrointestinal: Negative for abdominal pain.  Genitourinary: Negative for difficulty urinating.  Skin: Negative for rash.  Allergic/Immunologic: Negative for environmental allergies and food allergies.  Neurological: Negative for dizziness and headaches.   Objective: BP 116/80 (BP Location: Left Arm, Patient Position: Sitting, Cuff Size: Large)   Pulse 78   Temp 98.1 F (36.7 C) (Oral)   Resp 16   Ht 6\' 8"  (2.032 m)   Wt (!) 368 lb (166.9 kg)   SpO2 98%   BMI 40.43 kg/m  Body mass index is 40.43 kg/m. Physical Exam  Constitutional: He is oriented to person, place, and time. He appears well-developed and well-nourished.  HENT:  Head: Normocephalic and atraumatic.  Right Ear:  External ear normal.  Left Ear: External ear normal.  Nose: Nose normal.  Mouth/Throat: Oropharynx is clear and moist.  Eyes: Conjunctivae and EOM are normal.  Neck: Neck supple.  Cardiovascular: Normal rate, regular rhythm and normal heart sounds. Exam reveals no gallop and no friction rub.  No murmur heard. Pulmonary/Chest: Effort normal and breath sounds normal. He has no wheezes. He has no rales.  Abdominal: Soft. Bowel sounds are normal. There is no abdominal tenderness.  Lymphadenopathy:    He has no cervical adenopathy.  Neurological: He is alert and oriented to person, place, and time.  Skin: Skin is warm. No rash noted.  Psychiatric: He has a normal mood and affect. His behavior is normal.  Nursing note and vitals reviewed.  The plan was reviewed with the patient/family, and all questions/concerned were addressed.  It was my pleasure to see David Gonzalez today and participate in his care. Please feel free to contact me with any questions or concerns.  Sincerely,  Rexene Alberts, DO Allergy & Immunology  [facility] office: [phone] [facility] office: [phone]

## 2018-04-19 NOTE — Assessment & Plan Note (Signed)
Recommend steroid nasal spray as above.

## 2018-04-19 NOTE — Patient Instructions (Signed)
Get bloodwork and will make additional recommendations based on results. Try nasonex 1 spray daily and may increase to 2 sprays daily. Nasal saline spray (i.e., Simply Saline) or nasal saline lavage (i.e., NeilMed) is recommended as needed and prior to medicated nasal sprays.  May use over the counter antihistamines such as Zyrtec (cetirizine), Claritin (loratadine), Allegra (fexofenadine), or Xyzal (levocetirizine) daily as needed.  Follow up in 2 months

## 2018-04-21 LAB — IGE+ALLERGENS ZONE 2(30)
Alternaria Alternata IgE: <0.1 kU/L
Amer Sycamore IgE Qn: 0.14 kU/L — AB
Bermuda Grass IgE: 0.37 kU/L — AB
Cat Dander IgE: <0.1 kU/L
Cedar, Mountain IgE: 0.14 kU/L — AB
Cladosporium Herbarum IgE: <0.1 kU/L
D Farinae IgE: <0.1 kU/L
Dog Dander IgE: <0.1 kU/L
G010-IGE JOHNSON GRASS: 0.39 kU/L — AB
G017-IGE BAHIA GRASS: 0.43 kU/L — AB
Hickory, White IgE: 0.1 kU/L — AB
IgE (Immunoglobulin E), Serum: 105 IU/mL (ref 6–495)
MUGWORT IGE QN: 0.11 kU/L — AB
Mucor Racemosus IgE: <0.1 kU/L
Nettle IgE: 0.11 kU/L — AB
Penicillium Chrysogen IgE: <0.1 kU/L
Ragweed, Short IgE: 0.19 kU/L — AB
SHEEP SORREL IGE QN: 0.14 kU/L — AB
Stemphylium Herbarum IgE: <0.1 kU/L
Sweet gum IgE RAST Ql: 0.11 kU/L — AB
T001-IGE MAPLE/BOX ELDER: 0.14 kU/L — AB
T007-IGE OAK, WHITE: 0.13 kU/L — AB
T008-IGE ELM, AMERICAN: 0.14 kU/L — AB
Timothy Grass IgE: 1.83 kU/L — AB
W009-IGE PLANTAIN, ENGLISH: 0.15 kU/L — AB
W014-IGE PIGWEED, ROUGH: 0.12 kU/L — AB

## 2018-05-07 ENCOUNTER — Other Ambulatory Visit: Payer: Self-pay | Admitting: Family Medicine

## 2018-05-07 DIAGNOSIS — I10 Essential (primary) hypertension: Secondary | ICD-10-CM

## 2018-05-07 DIAGNOSIS — E782 Mixed hyperlipidemia: Secondary | ICD-10-CM

## 2018-06-21 ENCOUNTER — Encounter: Payer: Self-pay | Admitting: Allergy

## 2018-06-21 ENCOUNTER — Other Ambulatory Visit: Payer: Self-pay

## 2018-06-21 ENCOUNTER — Ambulatory Visit: Payer: BLUE CROSS/BLUE SHIELD | Admitting: Allergy

## 2018-06-21 VITALS — BP 156/82 | HR 76 | Resp 12

## 2018-06-21 DIAGNOSIS — H6983 Other specified disorders of Eustachian tube, bilateral: Secondary | ICD-10-CM

## 2018-06-21 DIAGNOSIS — R0602 Shortness of breath: Secondary | ICD-10-CM | POA: Diagnosis not present

## 2018-06-21 DIAGNOSIS — J3089 Other allergic rhinitis: Secondary | ICD-10-CM

## 2018-06-21 NOTE — Patient Instructions (Addendum)
Today's testing showed: Additional allergy to ragweed, cat and few molds.  Still allergic to grass and tree pollen.   May use over the counter antihistamines such as Zyrtec (cetirizine), Claritin (loratadine), Allegra (fexofenadine), or Xyzal (levocetirizine) daily as needed.  Continue Nasonex 1 spray daily.   Nasal saline spray (i.e., Simply Saline) or nasal saline lavage (i.e., NeilMed) is recommended as needed and prior to medicated nasal sprays.  Continue environmental control measures  Your breathing test looked good today. Monitor symptoms. Let me know if it gets worse.   Follow up in  6 months or sooner if needed.   Reducing Pollen Exposure . Pollen seasons: trees (spring), grass (summer) and ragweed/weeds (fall). [name] Kitchen Keep windows closed in your home and car to lower pollen exposure.  Susa Simmonds air conditioning in the bedroom and throughout the house if possible.  . Avoid going out in dry windy days - especially early morning. . Pollen counts are highest between 5 - 10 AM and on dry, hot and windy days.  . Save outside activities for late afternoon or after a heavy rain, when pollen levels are lower.  . Avoid mowing of grass if you have grass pollen allergy. [name] Kitchen Be aware that pollen can also be transported indoors on people and pets.  . Dry your clothes in an automatic dryer rather than hanging them outside where they might collect pollen.  . Rinse hair and eyes before bedtime.  Mold Control Mold and fungi can grow on a variety of surfaces provided certain temperature and moisture conditions exist.  Outdoor molds grow on plants, decaying vegetation and soil. The major outdoor mold, Alternaria and Cladosporium, are found in very high numbers during hot and dry conditions. Generally, a late summer - fall peak is seen for common outdoor fungal spores. Rain will temporarily lower outdoor mold spore count, but counts rise rapidly when the rainy period ends. The most important indoor  molds are Aspergillus and Penicillium. Dark, humid and poorly ventilated basements are ideal sites for mold growth. The next most common sites of mold growth are the bathroom and the kitchen. Outdoor (Seasonal) Mold Control Use air conditioning and keep windows closed. Avoid exposure to decaying vegetation. Avoid leaf raking. Avoid grain handling. Consider wearing a face mask if working in moldy areas.  Indoor (Perennial) Mold Control  Maintain humidity below 50%. Get rid of mold growth on hard surfaces with water, detergent and, if necessary, 5% bleach (do not mix with other cleaners). Then dry the area completely. If mold covers an area more than 10 square feet, consider hiring an indoor environmental professional. For clothing, washing with soap and water is best. If moldy items cannot be cleaned and dried, throw them away. Remove sources e.g. contaminated carpets. Repair and seal leaking roofs or pipes. Using dehumidifiers in damp basements may be helpful, but empty the water and clean units regularly to prevent mildew from forming. All rooms, especially basements, bathrooms and kitchens, require ventilation and cleaning to deter mold and mildew growth. Avoid carpeting on concrete or damp floors, and storing items in damp areas. Pet Allergen Avoidance: Contrary to popular opinion, there are no "hypoallergenic" breeds of dogs or cats. That is because people are not allergic to an animal's hair, but to an allergen found in the animal's saliva, dander (dead skin flakes) or urine. Pet allergy symptoms typically occur within minutes. For some people, symptoms can build up and become most severe 8 to 12 hours after contact with the animal. People  with severe allergies can experience reactions in public places if dander has been transported on the pet owners' clothing. Keeping an animal outdoors is only a partial solution, since homes with pets in the yard still have higher concentrations of animal  allergens. Before getting a pet, ask your allergist to determine if you are allergic to animals. If your pet is already considered part of your family, try to minimize contact and keep the pet out of the bedroom and other rooms where you spend a great deal of time. As with dust mites, vacuum carpets often or replace carpet with a hardwood floor, tile or linoleum. High-efficiency particulate air (HEPA) cleaners can reduce allergen levels over time. While dander and saliva are the source of cat and dog allergens, urine is the source of allergens from rabbits, hamsters, mice and [place] pigs; so ask a non-allergic family member to clean the animal's cage. If you have a pet allergy, talk to your allergist about the potential for allergy immunotherapy (allergy shots). This strategy can often provide long-term relief.

## 2018-06-21 NOTE — Assessment & Plan Note (Signed)
Past history - Perennial rhinitis symptoms for the past 20+ years but now having worsening symptoms including ear fullness the past year. Tried Flonase which caused epistaxis. ENT diagnosed with eustachian tube dysfunction and tinnitus. Interim history - 2020 immunocap was positive to grass pollen and borderline positive to tree pollen, weed and ragweed which did not explain his perennial symptoms.  Today's skin testing showed additional positive to cat and mold. No cats at home.  May use over the counter antihistamines such as Zyrtec (cetirizine), Claritin (loratadine), Allegra (fexofenadine), or Xyzal (levocetirizine) daily as needed.  Continue Nasonex 1 spray daily. BID dosing caused epistaxis.   Nasal saline spray (i.e., Simply Saline) or nasal saline lavage (i.e., NeilMed) is recommended as needed and prior to medicated nasal sprays.  Continue environmental control measures.

## 2018-06-21 NOTE — Progress Notes (Signed)
Follow Up Note  RE: KHA HARI MRN: [id number] DOB: 03-25-1971 Date of Office Visit: 06/21/2018  Referring provider: Mellody Dance, DO Primary care provider: Mellody Dance, DO  Chief Complaint: No chief complaint on file.  History of Present Illness: I had the pleasure of seeing Maxim Bedel for a follow up visit at the [facility] on 06/21/2018. He is a 48 y.o. male, who is being followed for allergic rhinitis and eustachian dysfunction. Today he is here for skin testing. His previous allergy office visit was on 04/19/2018 with Dr. Maudie Mercury.   Allergic rhinitis Patient has been using saline spray with some benefit. He also tried Nasonex 1 sprays once a day with some benefit.The twice a day dosing caused epistaxis.  He stopped antihistamines and noticed that he can't catch a full breath as before. This has happened to him before when his allergies were bothering him. Sometimes these symptoms flare after a meal but no history of reflux and no known food allergies.   Denies any coughing, wheezing, chest tightness, nocturnal awakenings, ER/urgent care visits or prednisone use since the last visit.  Ear symptoms have been stable.  Assessment and Plan: Panayiotis is a 48 y.o. male with: Other allergic rhinitis Past history - Perennial rhinitis symptoms for the past 20+ years but now having worsening symptoms including ear fullness the past year. Tried Flonase which caused epistaxis. ENT diagnosed with eustachian tube dysfunction and tinnitus. Interim history - 2020 immunocap was positive to grass pollen and borderline positive to tree pollen, weed and ragweed which did not explain his perennial symptoms.  Today's skin testing showed additional positive to cat and mold. No cats at home.  May use over the counter antihistamines such as Zyrtec (cetirizine), Claritin (loratadine), Allegra (fexofenadine), or Xyzal (levocetirizine) daily as needed.  Continue Nasonex 1  spray daily. BID dosing caused epistaxis.   Nasal saline spray (i.e., Simply Saline) or nasal saline lavage (i.e., NeilMed) is recommended as needed and prior to medicated nasal sprays.  Continue environmental control measures.  Dysfunction of eustachian tube Symptoms stable with no flare. Not sure if Nasonex improved symptoms or not.  If symptoms worsen advised patient to go see ENT again.   Shortness of breath Noticed some difficulty catching his breath since stopped antihistamines. Apparently this happened in the past and sometimes it flares after eating meals. No history of reflux. No change in diet.  Today's spirometry was normal.  Advised patient to restart antihistamines and to monitor symptoms.  If it flares, then may do a trial of SABA and/or low dose ICS inhaler.   Return in about 6 months (around 12/22/2018).  Diagnostics: Spirometry:  Tracings reviewed. His effort: Good reproducible efforts. FVC: 6.60L FEV1: 5.31L, 99% predicted FEV1/FVC ratio: 80% Interpretation: Spirometry consistent with normal pattern.  Please see scanned spirometry results for details.  Skin Testing: Environmental allergy panel. Positive test to: grass, ragweed, weed, trees, mold and cat.  Results discussed with patient/family. Airborne Adult Perc - 06/21/18 1445    Time Antigen Placed  1446    Allergen Manufacturer  Lavella Hammock    Location  Back    Number of Test  59    Panel 1  Select    1. Control-Buffer 50% Glycerol  Negative    2. Control-Histamine 1 mg/ml  3+    3. Albumin saline  Negative    4. [place]  4+    5. [place]  4+    6. [place]  4+  7. [place] Blue  4+    8. Meadow Fescue  4+    9. Perennial Rye  4+    10. Sweet Vernal  Negative    11. Timothy  4+    12. Cocklebur  Negative    13. Burweed [place]er  Negative    14. Ragweed, short  2+    15. Ragweed, Giant  Negative    16. Plantain,  English  Negative    17. Lamb's Quarters  Negative    18. Sheep Sorrell  Negative     19. Rough Pigweed  Negative    20. Marsh Elder, Rough  Negative    21. Mugwort, Common  Negative    22. Ash mix  Negative    23. Birch mix  Negative    24. Beech American  Negative    25. Box, Elder  Negative    26. Cedar, red  Negative    27. Cottonwood, [place]  Negative    28. Elm mix  Negative    29. Hickory mix  Negative    30. Maple mix  Negative    31. Oak, [place] mix  Negative    32. Pecan Pollen  Negative    33. Pine mix  Negative    34. Sycamore Eastern  Negative    35. [place], Black Pollen  Negative    36. Alternaria alternata  Negative    37. Cladosporium Herbarum  Negative    38. Aspergillus mix  Negative    39. Penicillium mix  Negative    40. Bipolaris sorokiniana (Helminthosporium)  Negative    41. Drechslera spicifera (Curvularia)  Negative    42. Mucor plumbeus  Negative    43. Fusarium moniliforme  Negative    44. Aureobasidium pullulans (pullulara)  Negative    45. Rhizopus oryzae  Negative    46. Botrytis cinera  Negative    47. Epicoccum nigrum  Negative    48. Phoma betae  Negative    49. Candida Albicans  Negative    50. Trichophyton mentagrophytes  Negative    51. Mite, D Farinae  5,000 AU/ml  Negative    52. Mite, D Pteronyssinus  5,000 AU/ml  Negative    53. Cat Hair 10,000 BAU/ml  Negative    54.  Dog Epithelia  Negative    55. Mixed Feathers  Negative    56. Horse Epithelia  Negative    57. Cockroach, German  Negative    58. Mouse  Negative    59. Tobacco Leaf  Negative     Intradermal - 06/21/18 1524    Time Antigen Placed  1524    Allergen Manufacturer  Lavella Hammock    Location  Arm    Number of Test  11    Intradermal  Select    Control  Negative    Weed mix  2+    Tree mix  2+    Mold 1  Negative    Mold 2  Negative    Mold 3  Negative    Mold 4  2+    Cat  2+    Dog  Negative    Cockroach  Negative    Mite mix  Negative       Medication List:  Current Outpatient Medications  Medication Sig Dispense Refill  . aspirin 81 MG  tablet Take 81 mg by mouth daily.    [name] Kitchen atorvastatin (LIPITOR) 20 MG tablet TAKE 1 TABLET BY MOUTH EVERYDAY AT BEDTIME  90 tablet 0  . Cholecalciferol (VITAMIN D3) 5000 units CAPS Take 1 capsule by mouth daily.    . hydrochlorothiazide (MICROZIDE) 12.5 MG capsule TAKE 1 CAPSULE BY MOUTH EVERY DAY 90 capsule 0  . lisinopril (PRINIVIL,ZESTRIL) 20 MG tablet TAKE 1 TABLET BY MOUTH EVERY DAY 90 tablet 1  . mometasone (NASONEX) 50 MCG/ACT nasal spray Place 2 sprays into the nose daily. 17 g 5  . cetirizine (ZYRTEC) 5 MG tablet Take 1 tablet by mouth daily.    [name] Kitchen loratadine (CLARITIN) 10 MG tablet Take 10 mg by mouth daily.     No current facility-administered medications for this visit.    Allergies: Allergies  Allergen Reactions  . Hydrocodone Other (See Comments)    Skin turns red   I reviewed his past medical history, social history, family history, and environmental history and no significant changes have been reported from previous visit on 04/19/2018.  Review of Systems  Constitutional: Negative for appetite change, chills, fever and unexpected weight change.  HENT: Negative for congestion, rhinorrhea and sneezing.   Eyes: Negative for itching.  Respiratory: Positive for shortness of breath. Negative for cough, chest tightness and wheezing.   Cardiovascular: Negative for chest pain.  Gastrointestinal: Negative for abdominal pain.  Genitourinary: Negative for difficulty urinating.  Skin: Negative for rash.  Allergic/Immunologic: Positive for environmental allergies. Negative for food allergies.  Neurological: Negative for dizziness and headaches.   Objective: BP (!) 156/82 (BP Location: Left Arm, Patient Position: Sitting, Cuff Size: Normal)   Pulse 76   Resp 12   SpO2 98%  There is no height or weight on file to calculate BMI. Physical Exam  Constitutional: He is oriented to person, place, and time. He appears well-developed and well-nourished.  HENT:  Head: Normocephalic and  atraumatic.  Right Ear: External ear normal.  Left Ear: External ear normal.  Nose: Nose normal.  Mouth/Throat: Oropharynx is clear and moist.  Eyes: Conjunctivae and EOM are normal.  Neck: Neck supple.  Cardiovascular: Normal rate, regular rhythm and normal heart sounds. Exam reveals no gallop and no friction rub.  No murmur heard. Pulmonary/Chest: Effort normal and breath sounds normal. He has no wheezes. He has no rales.  Abdominal: Soft.  Neurological: He is alert and oriented to person, place, and time.  Skin: Skin is warm. No rash noted.  Psychiatric: He has a normal mood and affect. His behavior is normal.  Nursing note and vitals reviewed.  Previous notes and tests were reviewed. The plan was reviewed with the patient/family, and all questions/concerned were addressed.  It was my pleasure to see David Gonzalez today and participate in his care. Please feel free to contact me with any questions or concerns.  Sincerely,  Rexene Alberts, DO Allergy & Immunology  [facility] office: [phone] [facility] office: [phone]

## 2018-06-21 NOTE — Assessment & Plan Note (Signed)
Noticed some difficulty catching his breath since stopped antihistamines. Apparently this happened in the past and sometimes it flares after eating meals. No history of reflux. No change in diet.  Today's spirometry was normal.  Advised patient to restart antihistamines and to monitor symptoms.  If it flares, then may do a trial of SABA and/or low dose ICS inhaler.

## 2018-06-21 NOTE — Assessment & Plan Note (Signed)
Symptoms stable with no flare. Not sure if Nasonex improved symptoms or not.  If symptoms worsen advised patient to go see ENT again.

## 2018-06-22 ENCOUNTER — Other Ambulatory Visit: Payer: Self-pay | Admitting: Family Medicine

## 2018-06-22 DIAGNOSIS — I10 Essential (primary) hypertension: Secondary | ICD-10-CM

## 2018-06-28 ENCOUNTER — Ambulatory Visit (INDEPENDENT_AMBULATORY_CARE_PROVIDER_SITE_OTHER): Payer: BLUE CROSS/BLUE SHIELD | Admitting: Family Medicine

## 2018-06-28 ENCOUNTER — Other Ambulatory Visit: Payer: Self-pay

## 2018-06-28 DIAGNOSIS — E782 Mixed hyperlipidemia: Secondary | ICD-10-CM

## 2018-06-28 DIAGNOSIS — E786 Lipoprotein deficiency: Secondary | ICD-10-CM

## 2018-06-28 DIAGNOSIS — I10 Essential (primary) hypertension: Secondary | ICD-10-CM | POA: Diagnosis not present

## 2018-06-28 MED ORDER — LISINOPRIL 20 MG PO TABS: 20.0000 mg | ORAL_TABLET | Freq: Every day | ORAL | 0 refills | Status: DC

## 2018-06-28 MED ORDER — HYDROCHLOROTHIAZIDE 12.5 MG PO CAPS: 12.5000 mg | ORAL_CAPSULE | Freq: Every day | ORAL | 0 refills | Status: DC

## 2018-06-28 MED ORDER — ATORVASTATIN CALCIUM 20 MG PO TABS: ORAL_TABLET | ORAL | 0 refills | Status: DC

## 2018-06-28 NOTE — Progress Notes (Signed)
Virtual Visit via Telephone Note for [employer], D.O- Primary Care Physician at [facility] pt at 1:55pm and no answer- left message on answering machine, will call back in 5-10min.     I connected with current patient today by telephone and verified that I am speaking with the correct person using two identifiers.   I discussed the limitations, risks, security and privacy concerns of performing an evaluation and management service by telephone and the limited availability of in person appointments during this current national crisis of the Covid-19 pandemic.  My staff members also discussed with the patient that there may be a patient responsible charge related to this service.  The patient expressed understanding and agreed to proceed.     History of Present Illness:  HPI:   BP-  Hasn't has labs since 2/19; hasn't been checking BP at all.  DID NOT check vitals today as instructed prior to OV.  Feeling well , no complaints   Chol-  Hasn't been checked since 05/11/17 either;  Pt denies s-e and denies any concerns.   Obese:  not working on diet/ exercise- not sure what Wt is.    Recently seen at allergist- various tree and grass allergies- r/w pt today as he had Q's.  Not taking preventive action with nasal saline rinses etc     Recent Results (from the past 2160 hour(s))  Allergens Zone 2     Status: Abnormal   Collection Time: 04/19/18 10:33 AM  Result Value Ref Range   Class Description Comment     Comment:     Levels of Specific IgE       Class  Description of Class     ---------------------------  -----  --------------------                    < 0.10         0         Negative            0.10 -    0.31         0/I       Equivocal/Low            0.32 -    0.55         I         Low            0.56 -    1.40         II        Moderate            1.41 -    3.90         III       High            3.91 -   19.00         IV        Very High            19.01 -  100.00         V         Very High                   >100.00         VI        Very High    IgE (Immunoglobulin E), Serum 105 6 - 495 IU/mL   D Pteronyssinus  IgE <0.10 Class 0 kU/L   D Farinae IgE <0.10 Class 0 kU/L   Cat Dander IgE <0.10 Class 0 kU/L   Dog Dander IgE <0.10 Class 0 kU/L   [place] Grass IgE 0.37 (A) Class I kU/L   Timothy Grass IgE 1.83 (A) Class III kU/L   Johnson Grass IgE 0.39 (A) Class I kU/L   Bahia Grass IgE 0.43 (A) Class I kU/L   Cockroach, American IgE <0.10 Class 0 kU/L   Penicillium Chrysogen IgE <0.10 Class 0 kU/L   Cladosporium Herbarum IgE <0.10 Class 0 kU/L   Aspergillus Fumigatus IgE <0.10 Class 0 kU/L   Mucor Racemosus IgE <0.10 Class 0 kU/L   Alternaria Alternata IgE <0.10 Class 0 kU/L   Stemphylium Herbarum IgE <0.10 Class 0 kU/L   Common Silver Wendee Copp IgE <0.10 Class 0 kU/L   Oak, White IgE 0.13 (A) Class 0/I kU/L   Elm, American IgE 0.14 (A) Class 0/I kU/L   Maple/Box Elder IgE 0.14 (A) Class 0/I kU/L   Hickory, White IgE 0.10 (A) Class 0/I kU/L   Amer Sycamore IgE Qn 0.14 (A) Class 0/I kU/L   White Mulberry IgE <0.10 Class 0 kU/L   Sweet gum IgE RAST Ql 0.11 (A) Class 0/I kU/L   Cedar, [place] IgE 0.14 (A) Class 0/I kU/L   Ragweed, Short IgE 0.19 (A) Class 0/I kU/L   Mugwort IgE Qn 0.11 (A) Class 0/I kU/L   Plantain, English IgE 0.15 (A) Class 0/I kU/L   Pigweed, Rough IgE 0.12 (A) Class 0/I kU/L   Sheep Sorrel IgE Qn 0.14 (A) Class 0/I kU/L   Nettle IgE 0.11 (A) Class 0/I kU/L     Wt Readings from Last 3 Encounters:  04/19/18 (!) 368 lb (166.9 kg)  02/22/18 (!) 375 lb 12.8 oz (170.5 kg)  09/04/17 (!) 360 lb (163.3 kg)   BP Readings from Last 3 Encounters:  06/21/18 (!) 156/82  04/19/18 116/80  02/22/18 119/78   Pulse Readings from Last 3 Encounters:  06/21/18 76  04/19/18 78  02/22/18 77   BMI Readings from Last 3 Encounters:  04/19/18 40.43 kg/m  02/22/18 41.28 kg/m  09/04/17 39.55 kg/m     Patient Care  Team    Relationship Specialty Notifications Start End  Mellody Dance, DO PCP - General Family Medicine  09/10/15   Dermatology, [facility]    08/17/17    Comment: Goes for yearly skin screenings     Patient Active Problem List   Diagnosis Date Noted  . Prediabetes 10/15/2015    Priority: High  . Low HDL (under 40) 10/15/2015    Priority: High  . Mixed hyperlipidemia 09/11/2015    Priority: High  . Morbid obesity ([place]) 09/11/2015    Priority: High  . Essential hypertension 03/08/2014    Priority: High  . Tinnitus of right ear 02/22/2018    Priority: Medium  . Chronic sinusitis 09/04/2017    Priority: Low  . Other allergic rhinitis 06/21/2018  . Shortness of breath 06/21/2018  . Chronic rhinitis 04/19/2018  . Hearing disorder of both ears 09/04/2017  . Dysfunction of eustachian tube 09/04/2017  . No-show for appointment 04/12/2016  . Vitamin D insufficiency 10/15/2015  . mild occ Fatigue 10/08/2015  . Environmental and seasonal allergies 09/11/2015  . Callus of foot 09/11/2015     Current Meds  Medication Sig  . aspirin 81 MG tablet Take 81 mg by mouth daily.  [name] Kitchen atorvastatin (LIPITOR) 20 MG tablet TAKE 1 TABLET  BY MOUTH EVERYDAY AT BEDTIME  . Cholecalciferol (VITAMIN D3) 5000 units CAPS Take 1 capsule by mouth daily.  . hydrochlorothiazide (MICROZIDE) 12.5 MG capsule Take 1 capsule (12.5 mg total) by mouth daily.  [name] Kitchen lisinopril (PRINIVIL,ZESTRIL) 20 MG tablet Take 1 tablet (20 mg total) by mouth daily.  [name] Kitchen loratadine (CLARITIN) 10 MG tablet Take 10 mg by mouth daily.  . mometasone (NASONEX) 50 MCG/ACT nasal spray Place 2 sprays into the nose daily.  . [DISCONTINUED] atorvastatin (LIPITOR) 20 MG tablet TAKE 1 TABLET BY MOUTH EVERYDAY AT BEDTIME  . [DISCONTINUED] hydrochlorothiazide (MICROZIDE) 12.5 MG capsule TAKE 1 CAPSULE BY MOUTH EVERY DAY  . [DISCONTINUED] lisinopril (PRINIVIL,ZESTRIL) 20 MG tablet TAKE 1 TABLET BY MOUTH EVERY DAY     Allergies:  Allergies   Allergen Reactions  . Hydrocodone Other (See Comments)    Skin turns red     ROS:  See above HPI for pertinent positives and negatives   Objective:   There were no vitals taken for this visit. There is no height or weight on file to calculate BMI. - pt did not take any   General: sounds in no acute distress.  Skin: Pt confirms warm and dry  extremities and pink fingertips Respiratory: speaking in full sentences, no conversational dyspnea Psych: A and O *3, appears insight good, mood- full      Impression and Recommendations:    1. Essential hypertension Rf meds - Extensively discussed importance of diet/ exercise as pertains to pt's BP.  All pt's questions/ concerns were addressed.   - NEEDS TO CHECK AT HOME and keep log; d/c pt goal BP- bring next OV  - hydrochlorothiazide (MICROZIDE) 12.5 MG capsule; Take 1 capsule (12.5 mg total) by mouth daily.  Dispense: 90 capsule; Refill: 0 - lisinopril (PRINIVIL,ZESTRIL) 20 MG tablet; Take 1 tablet (20 mg total) by mouth daily.  Dispense: 90 tablet; Refill: 0   2. Mixed hyperlipidemia Needs FLP and all bloodwrk  - atorvastatin (LIPITOR) 20 MG tablet; TAKE 1 TABLET BY MOUTH EVERYDAY AT BEDTIME  Dispense: 90 tablet; Refill: 0   3. Low HDL (under 40) Exercise- move more.    4. Morbid obesity ([place]) Extensively discussed importance of diet/ exercise as pertains to pt's obesity and effects of his health.  All pt's questions/ concerns were addressed.      I discussed the assessment and treatment plan with the patient. The patient was provided an opportunity to ask questions and all were answered. The patient agreed with the plan and demonstrated an understanding of the instructions.   The patient was advised to call back or seek an in-person evaluation if the symptoms worsen or if the condition fails to improve as anticipated.    Meds ordered this encounter  Medications  . hydrochlorothiazide (MICROZIDE) 12.5 MG capsule     Sig: Take 1 capsule (12.5 mg total) by mouth daily.    Dispense:  90 capsule    Refill:  0  . lisinopril (PRINIVIL,ZESTRIL) 20 MG tablet    Sig: Take 1 tablet (20 mg total) by mouth daily.    Dispense:  90 tablet    Refill:  0  . atorvastatin (LIPITOR) 20 MG tablet    Sig: TAKE 1 TABLET BY MOUTH EVERYDAY AT BEDTIME    Dispense:  90 tablet    Refill:  0    Medications Discontinued During This Encounter  Medication Reason  . hydrochlorothiazide (MICROZIDE) 12.5 MG capsule Reorder  . lisinopril (PRINIVIL,ZESTRIL) 20 MG tablet Reorder  .  atorvastatin (LIPITOR) 20 MG tablet Reorder     Gross side effects, risk and benefits, and alternatives of medications and treatment plan in general discussed with patient.  Patient is aware that all medications have potential side effects and we are unable to predict every side effect or drug-drug interaction that may occur.   Patient was strongly encouraged to call with any questions or concerns they may have concerns.    Expresses verbal understanding and consents to current therapy and treatment regimen.  No barriers to understanding were identified.  Red flag symptoms and signs discussed in detail.  Patient expressed understanding regarding what to do in case of emergency\urgent symptoms  Please see AVS handed out to patient at the end of our visit for further patient instructions/ counseling done pertaining to today's office visit.  I provided 17+ minutesof non-face-to-face time during this encounter.     Mellody Dance, DO

## 2018-08-24 ENCOUNTER — Emergency Department (HOSPITAL_COMMUNITY): Payer: BLUE CROSS/BLUE SHIELD

## 2018-08-24 ENCOUNTER — Encounter (HOSPITAL_COMMUNITY): Payer: Self-pay

## 2018-08-24 ENCOUNTER — Other Ambulatory Visit: Payer: Self-pay

## 2018-08-24 ENCOUNTER — Emergency Department (HOSPITAL_COMMUNITY)
Admission: EM | Admit: 2018-08-24 | Discharge: 2018-08-24 | Disposition: A | Discharge status: Home / Self Care | Payer: BLUE CROSS/BLUE SHIELD | Attending: Emergency Medicine | Admitting: Emergency Medicine

## 2018-08-24 DIAGNOSIS — J029 Acute pharyngitis, unspecified: Secondary | ICD-10-CM | POA: Insufficient documentation

## 2018-08-24 DIAGNOSIS — Z79899 Other long term (current) drug therapy: Secondary | ICD-10-CM | POA: Insufficient documentation

## 2018-08-24 DIAGNOSIS — Z7982 Long term (current) use of aspirin: Secondary | ICD-10-CM | POA: Diagnosis not present

## 2018-08-24 DIAGNOSIS — I10 Essential (primary) hypertension: Secondary | ICD-10-CM | POA: Diagnosis not present

## 2018-08-24 DIAGNOSIS — B349 Viral infection, unspecified: Secondary | ICD-10-CM | POA: Insufficient documentation

## 2018-08-24 DIAGNOSIS — Z20828 Contact with and (suspected) exposure to other viral communicable diseases: Secondary | ICD-10-CM | POA: Insufficient documentation

## 2018-08-24 DIAGNOSIS — R509 Fever, unspecified: Secondary | ICD-10-CM | POA: Diagnosis present

## 2018-08-24 LAB — GROUP A STREP BY PCR: Group A Strep by PCR: NOT DETECTED

## 2018-08-24 LAB — SARS CORONAVIRUS 2 BY RT PCR (HOSPITAL ORDER, PERFORMED IN ~~LOC~~ HOSPITAL LAB): SARS Coronavirus 2: NEGATIVE

## 2018-08-24 NOTE — ED Provider Notes (Signed)
[facility] DEPT Provider Note   CSN: [id number] Arrival date & time: 08/24/18  1641    History   Chief Complaint Chief Complaint  Patient presents with  . Fever  . Headache  . Generalized Body Aches    HPI David Gonzalez is a 48 y.o. male.     HPI Patient presents with 2 days of subjective fevers and chills.  He is also having diaphoresis.  Complains of nasal congestion and sore throat.  He has had minimal nonproductive cough.  Endorses diffuse myalgias.  States her daughter was recently diagnosed with strep throat and is currently on antibiotics.  Denies any known coronavirus exposure. Past Medical History:  Diagnosis Date  . Complication of anesthesia    reported to be violent waking from anesthesia at age 59  . Headache(784.0)   . Mental disorder    mildly claustrophobic    Patient Active Problem List   Diagnosis Date Noted  . Other allergic rhinitis 06/21/2018  . Shortness of breath 06/21/2018  . Chronic rhinitis 04/19/2018  . Tinnitus of right ear 02/22/2018  . Hearing disorder of both ears 09/04/2017  . Chronic sinusitis 09/04/2017  . Dysfunction of eustachian tube 09/04/2017  . No-show for appointment 04/12/2016  . Prediabetes 10/15/2015  . Low HDL (under 40) 10/15/2015  . Vitamin D insufficiency 10/15/2015  . mild occ Fatigue 10/08/2015  . Environmental and seasonal allergies 09/11/2015  . Mixed hyperlipidemia 09/11/2015  . Morbid obesity ([place]) 09/11/2015  . Callus of foot 09/11/2015  . Essential hypertension 03/08/2014    Past Surgical History:  Procedure Laterality Date  . ANTERIOR CERVICAL DECOMP/DISCECTOMY FUSION  04/30/2011   Procedure: ANTERIOR CERVICAL DECOMPRESSION/DISCECTOMY FUSION 1 LEVEL;  Surgeon: Dahlia Bailiff, MD;  Location: [facility];  Service: Orthopedics;  Laterality: N/A;  ACDF C5-6  . KNEE ARTHROSCOPY    . TONSILLECTOMY          Home Medications    Prior to Admission medications   Medication  Sig Start Date End Date Taking? Authorizing Provider  aspirin 81 MG tablet Take 81 mg by mouth daily.   Yes [provider]  atorvastatin (LIPITOR) 20 MG tablet TAKE 1 TABLET BY MOUTH EVERYDAY AT BEDTIME 06/28/18  Yes Opalski, Deborah, DO  Cholecalciferol (VITAMIN D3) 5000 units CAPS Take 1 capsule by mouth daily.   Yes [provider]  hydrochlorothiazide (MICROZIDE) 12.5 MG capsule Take 1 capsule (12.5 mg total) by mouth daily. 06/28/18  Yes Opalski, Neoma Laming, DO  lisinopril (PRINIVIL,ZESTRIL) 20 MG tablet Take 1 tablet (20 mg total) by mouth daily. 06/28/18  Yes Opalski, Neoma Laming, DO  loratadine (CLARITIN) 10 MG tablet Take 10 mg by mouth daily.   Yes [provider]  mometasone (NASONEX) 50 MCG/ACT nasal spray Place 2 sprays into the nose daily. 04/19/18  Yes Garnet Sierras, DO  cetirizine (ZYRTEC) 5 MG tablet Take 1 tablet by mouth daily. 09/11/17   [provider]    Family History Family History  Problem Relation Age of Onset  . Diabetes Mother   . Hypertension Mother   . Arthritis Mother   . Neuropathy Mother   . Hypertension Father   . Cancer Father   . Hypertension Sister   . Hypertension Maternal Grandmother   . Schizophrenia Maternal Grandfather   . Hypertension Paternal Grandmother   . Hypertension Paternal Grandfather     Social History Social History   Tobacco Use  . Smoking status: Never Smoker  . Smokeless tobacco:  Never Used  Substance Use Topics  . Alcohol use: No  . Drug use: No     Allergies   Hydrocodone   Review of Systems Review of Systems  Constitutional: Positive for chills and fever.  HENT: Positive for congestion, sinus pressure and sore throat. Negative for trouble swallowing.   Eyes: Negative for visual disturbance.  Respiratory: Positive for cough. Negative for shortness of breath.   Cardiovascular: Negative for chest pain, palpitations and leg swelling.  Gastrointestinal: Negative for abdominal pain, diarrhea,  nausea and vomiting.  Genitourinary: Negative for dysuria, flank pain and frequency.  Musculoskeletal: Positive for myalgias. Negative for arthralgias, back pain and neck pain.  Skin: Negative for rash and wound.  Neurological: Positive for headaches. Negative for dizziness, weakness, light-headedness and numbness.  All other systems reviewed and are negative.    Physical Exam Updated Vital Signs BP 119/67   Pulse 76   Temp 99.3 F (37.4 C) (Oral)   Resp 18   Ht 6\' 8"  (2.032 m)   Wt (!) 161 kg   SpO2 96%   BMI 39.00 kg/m   Physical Exam Vitals signs and nursing note reviewed.  Constitutional:      General: He is not in acute distress.    Appearance: Normal appearance. He is well-developed. He is not ill-appearing.  HENT:     Head: Normocephalic and atraumatic.     Nose: Congestion present.     Mouth/Throat:     Mouth: Mucous membranes are moist.     Pharynx: Posterior oropharyngeal erythema present. No oropharyngeal exudate.     Comments: Patient has several vesicles in the posterior oropharynx with erythema. Eyes:     Pupils: Pupils are equal, round, and reactive to light.  Neck:     Musculoskeletal: Normal range of motion and neck supple. No neck rigidity or muscular tenderness.     Vascular: No carotid bruit.     Comments: No meningismus Cardiovascular:     Rate and Rhythm: Normal rate and regular rhythm.     Heart sounds: No murmur. No friction rub. No gallop.   Pulmonary:     Effort: Pulmonary effort is normal. No respiratory distress.     Breath sounds: Normal breath sounds. No stridor. No wheezing, rhonchi or rales.  Chest:     Chest wall: No tenderness.  Abdominal:     General: Bowel sounds are normal.     Palpations: Abdomen is soft.     Tenderness: There is no abdominal tenderness. There is no right CVA tenderness, left CVA tenderness, guarding or rebound.  Musculoskeletal: Normal range of motion.        General: No swelling, tenderness, deformity or  signs of injury.     Right lower leg: No edema.     Left lower leg: No edema.  Lymphadenopathy:     Cervical: No cervical adenopathy.  Skin:    General: Skin is warm and dry.     Capillary Refill: Capillary refill takes less than 2 seconds.     Findings: No erythema or rash.  Neurological:     General: No focal deficit present.     Mental Status: He is alert and oriented to person, place, and time.  Psychiatric:        Mood and Affect: Mood normal.        Behavior: Behavior normal.      ED Treatments / Results  Labs (all labs ordered are listed, but only abnormal results are displayed) Labs Reviewed  GROUP A STREP BY PCR  SARS CORONAVIRUS 2 (HOSPITAL ORDER, [facility])    EKG None  Radiology Dg Chest Port 1 View  Result Date: 08/24/2018 CLINICAL DATA:  Headaches and body aches with lack of appetite for 1 week. Fever yesterday. EXAM: PORTABLE CHEST 1 VIEW COMPARISON:  May 21, 2011 FINDINGS: Scarring laterally at the left lung base is slightly more pronounced than on 05/21/2011. Borderline enlargement of the cardiopericardial silhouette, without edema. Lungs appear otherwise clear. No blunting of the costophrenic angles. IMPRESSION: 1. Borderline enlargement of the cardiopericardial silhouette, without edema. 2. Scarring laterally at the left lung base is slightly more pronounced than on 05/21/11. Electronically Signed   By: Van Clines M.D.   On: 08/24/2018 18:20    Procedures Procedures (including critical care time)  Medications Ordered in ED Medications - No data to display   Initial Impression / Assessment and Plan / ED Course  I have reviewed the triage vital signs and the nursing notes.  Pertinent labs & imaging results that were available during my care of the patient were reviewed by me and considered in my medical decision making (see chart for details).       Strep and coronavirus testing are negative.  Chest x-ray without  definite evidence of pneumonia.  Likely viral pharyngitis.  Return precautions given.   Final Clinical Impressions(s) / ED Diagnoses   Final diagnoses:  Viral pharyngitis    ED Discharge Orders    None       Julianne Rice, MD 08/24/18 1940

## 2018-08-24 NOTE — ED Triage Notes (Signed)
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

## 2018-10-20 ENCOUNTER — Other Ambulatory Visit: Payer: Self-pay | Admitting: Allergy

## 2018-10-21 ENCOUNTER — Telehealth: Payer: Self-pay | Admitting: Family Medicine

## 2018-10-21 DIAGNOSIS — Z20822 Contact with and (suspected) exposure to covid-19: Secondary | ICD-10-CM

## 2018-10-21 NOTE — Telephone Encounter (Signed)
Order placed for COVID testing.  Please advise pt.  David Gonzalez, CMA

## 2018-10-21 NOTE — Telephone Encounter (Signed)
Patient called states he & wife David Gonzalez /DOB 08[birth year]15[birth year]) are having same symptoms of SOB, Body aches, Fatigue----Both wish to be tested for COVID 19.  --Advised patient  Dr. Raliegh Scarlet & medical asst are out of office today bu would forward request to covering Provider for review.  --Forwarding request to medical assistant for review w/provider & test ordering---Please call patient if there are any question/ concerns.  Ph# [phone]  --glh

## 2018-10-22 ENCOUNTER — Other Ambulatory Visit: Payer: Self-pay

## 2018-10-22 DIAGNOSIS — Z20822 Contact with and (suspected) exposure to covid-19: Secondary | ICD-10-CM

## 2018-10-25 LAB — NOVEL CORONAVIRUS, NAA: SARS-CoV-2, NAA: DETECTED — AB

## 2018-11-02 ENCOUNTER — Other Ambulatory Visit: Payer: Self-pay | Admitting: *Deleted

## 2018-11-02 MED ORDER — MOMETASONE FUROATE 50 MCG/ACT NA SUSP: 2.0000 | Freq: Every day | NASAL | 0 refills | Status: DC

## 2018-11-02 NOTE — Telephone Encounter (Signed)
90 day request for rx

## 2018-11-10 ENCOUNTER — Other Ambulatory Visit: Payer: Self-pay | Admitting: Family Medicine

## 2018-11-10 DIAGNOSIS — E782 Mixed hyperlipidemia: Secondary | ICD-10-CM

## 2018-11-11 ENCOUNTER — Telehealth: Payer: Self-pay | Admitting: Family Medicine

## 2018-11-11 NOTE — Telephone Encounter (Signed)
Noted MPulliam, CMA/RT(R)  

## 2018-11-11 NOTE — Telephone Encounter (Signed)
Left patient message to contact office to set up provider required [place] for future Rx refills .  --FYI to medical assistant.  --Dion Body

## 2018-11-11 NOTE — Telephone Encounter (Signed)
-----   Message from Jerilee Field, [place] sent at 11/10/2018 11:48 AM EDT ----- Patient is due for follow up, please call the patient to make an appointment.  30 days of meds sent in Thanks. MPulliam, CMA/RT(R)

## 2018-12-22 ENCOUNTER — Other Ambulatory Visit: Payer: Self-pay

## 2018-12-22 ENCOUNTER — Encounter: Payer: Self-pay | Admitting: Allergy

## 2018-12-22 ENCOUNTER — Ambulatory Visit (INDEPENDENT_AMBULATORY_CARE_PROVIDER_SITE_OTHER): Payer: BC Managed Care – PPO | Admitting: Allergy

## 2018-12-22 VITALS — BP 130/78 | HR 71 | Temp 98.3°F | Resp 16 | Ht >= 80 in | Wt 366.8 lb

## 2018-12-22 DIAGNOSIS — R0602 Shortness of breath: Secondary | ICD-10-CM

## 2018-12-22 DIAGNOSIS — H1013 Acute atopic conjunctivitis, bilateral: Secondary | ICD-10-CM | POA: Diagnosis not present

## 2018-12-22 DIAGNOSIS — J302 Other seasonal allergic rhinitis: Secondary | ICD-10-CM | POA: Diagnosis not present

## 2018-12-22 DIAGNOSIS — J3089 Other allergic rhinitis: Secondary | ICD-10-CM | POA: Diagnosis not present

## 2018-12-22 MED ORDER — ALBUTEROL SULFATE HFA 108 (90 BASE) MCG/ACT IN AERS: 1.0000 | INHALATION_SPRAY | RESPIRATORY_TRACT | 5 refills | Status: DC | PRN

## 2018-12-22 MED ORDER — PAZEO 0.7 % OP SOLN: 1.0000 [drp] | Freq: Every day | OPHTHALMIC | 5 refills | Status: DC | PRN

## 2018-12-22 NOTE — Patient Instructions (Addendum)
Other allergic rhinitis Past testing positive to grass pollen and borderline positive to tree pollen, weed, cat and mold.   May use pazeo 1 drop in each eye as needed daily for itchy/watery eyes.  Get eyes checked.   May use over the counter antihistamines such as Zyrtec (cetirizine), Claritin (loratadine), Allegra (fexofenadine), or Xyzal (levocetirizine) daily as needed.  Continue Nasonex 1 spray daily. BID dosing caused epistaxis.   Nasal saline spray (i.e., Simply Saline) or nasal saline lavage (i.e., NeilMed) is recommended as needed and prior to medicated nasal sprays.  Continue environmental control measures.  Shortness of breath  May use albuterol rescue inhaler 2 puffs or nebulizer every 4 to 6 hours as needed for shortness of breath, chest tightness, coughing, and wheezing. May use albuterol rescue inhaler 2 puffs 5 to 15 minutes prior to strenuous physical activities. Monitor frequency of use.   Return in about 6 months or sooner if needed.

## 2018-12-22 NOTE — Assessment & Plan Note (Signed)
   See assessment and plan as above for allergic rhinitis.  

## 2018-12-22 NOTE — Assessment & Plan Note (Signed)
Past history - Perennial rhinitis symptoms for the past 20+ years but now having worsening symptoms including ear fullness the past year. Tried Flonase which caused epistaxis. ENT diagnosed with eustachian tube dysfunction and tinnitus. 2020 immunocap was positive to grass pollen and borderline positive to tree pollen, weed and ragweed. 2020 skin testing showed additional positive to cat and mold. No cats at home. Interim history - using Nasonex prn with good benefit. Noticed some increased eye symptoms the last few weeks.  May use pazeo 1 drop in each eye as needed daily for itchy/watery eyes.  Get eyes checked.   May use over the counter antihistamines such as Zyrtec (cetirizine), Claritin (loratadine), Allegra (fexofenadine), or Xyzal (levocetirizine) daily as needed.  Continue Nasonex 1 spray daily as needed. BID dosing caused epistaxis.   Nasal saline spray (i.e., Simply Saline) or nasal saline lavage (i.e., NeilMed) is recommended as needed and prior to medicated nasal sprays.  Continue environmental control measures.

## 2018-12-22 NOTE — Progress Notes (Signed)
Follow Up Note  RE: David Gonzalez MRN: [id number] DOB: [birth year] Date of Office Visit: 12/22/2018  Referring provider: Mellody Dance, DO Primary care provider: Mellody Dance, DO  Chief Complaint: Allergic Rhinitis  (eyes itching, mucous in eyes constantly all day)  History of Present Illness: I had the pleasure of seeing David Gonzalez for a follow up visit at the [facility] on 12/22/2018. He is a 48 y.o. male, who is being followed for allergic rhinitis, eustachian tube dysfunction, and shortness of breath. Today he is here for regular follow up visit. His previous allergy office visit was on 06/21/2018 with Dr. Maudie Mercury.   Other allergic rhinitis Patient has been having issues with itchy/watery and goopy eyes for the past 1 month. Currently on zyrtec daily and using nasonex as needed with good benefit. No nosebleeds.  No recent eye exam.   Dysfunction of eustachian tube No issues with this.  Shortness of breath Patient had COVID in July and had some issues with SOB at that time. Used daughter's inhaler and not sure what it was with unknown benefit.  Otherwise his breathing has been doing well.  Assessment and Plan: David Gonzalez is a 48 y.o. male with: Seasonal and perennial allergic rhinitis Past history - Perennial rhinitis symptoms for the past 20+ years but now having worsening symptoms including ear fullness the past year. Tried Flonase which caused epistaxis. ENT diagnosed with eustachian tube dysfunction and tinnitus. 2020 immunocap was positive to grass pollen and borderline positive to tree pollen, weed and ragweed. 2020 skin testing showed additional positive to cat and mold. No cats at home. Interim history - using Nasonex prn with good benefit. Noticed some increased eye symptoms the last few weeks.  May use pazeo 1 drop in each eye as needed daily for itchy/watery eyes.  Get eyes checked.   May use over the counter antihistamines such as Zyrtec  (cetirizine), Claritin (loratadine), Allegra (fexofenadine), or Xyzal (levocetirizine) daily as needed.  Continue Nasonex 1 spray daily as needed. BID dosing caused epistaxis.   Nasal saline spray (i.e., Simply Saline) or nasal saline lavage (i.e., NeilMed) is recommended as needed and prior to medicated nasal sprays.  Continue environmental control measures.  Allergic conjunctivitis of both eyes  See assessment and plan as above for allergic rhinitis.   Shortness of breath Past history - Noticed some difficulty catching his breath since stopped antihistamines. Apparently this happened in the past and sometimes it flares after eating meals. No history of reflux. No change in diet. 2020 spirometry was normal. Interim history - had some SOB during COVID diagnosis.   May use albuterol rescue inhaler 2 puffs or nebulizer every 4 to 6 hours as needed for shortness of breath, chest tightness, coughing, and wheezing. May use albuterol rescue inhaler 2 puffs 5 to 15 minutes prior to strenuous physical activities. Monitor frequency of use.   Get spirometry at next visit.   Return in about 6 months (around 06/21/2019).  Meds ordered this encounter  Medications  . Olopatadine HCl (PAZEO) 0.7 % SOLN    Sig: Place 1 drop into both eyes daily as needed.    Dispense:  2.5 mL    Refill:  5  . albuterol (VENTOLIN HFA) 108 (90 Base) MCG/ACT inhaler    Sig: Inhale 1-2 puffs into the lungs every 4 (four) hours as needed for wheezing or shortness of breath.    Dispense:  18 g    Refill:  5   Diagnostics:  None.  Medication List:  Current Outpatient Medications  Medication Sig Dispense Refill  . aspirin 81 MG tablet Take 81 mg by mouth daily.    [name] Kitchen atorvastatin (LIPITOR) 20 MG tablet Take 1 tablet (20 mg total) by mouth at bedtime. Needs office visit for refills 90 tablet 0  . cetirizine (ZYRTEC) 5 MG tablet Take 1 tablet by mouth daily.    . Cholecalciferol (VITAMIN D3) 5000 units CAPS Take 1  capsule by mouth daily.    . hydrochlorothiazide (MICROZIDE) 12.5 MG capsule Take 1 capsule (12.5 mg total) by mouth daily. 90 capsule 0  . lisinopril (PRINIVIL,ZESTRIL) 20 MG tablet Take 1 tablet (20 mg total) by mouth daily. 90 tablet 0  . mometasone (NASONEX) 50 MCG/ACT nasal spray Place 2 sprays into the nose daily. 51 g 0  . albuterol (VENTOLIN HFA) 108 (90 Base) MCG/ACT inhaler Inhale 1-2 puffs into the lungs every 4 (four) hours as needed for wheezing or shortness of breath. 18 g 5  . Olopatadine HCl (PAZEO) 0.7 % SOLN Place 1 drop into both eyes daily as needed. 2.5 mL 5   No current facility-administered medications for this visit.    Allergies: Allergies  Allergen Reactions  . Hydrocodone Other (See Comments)    Skin turns red   I reviewed his past medical history, social history, family history, and environmental history and no significant changes have been reported from previous visit on 06/21/2018.  Review of Systems  Constitutional: Negative for appetite change, chills, fever and unexpected weight change.  HENT: Negative for congestion, rhinorrhea and sneezing.   Eyes: Positive for itching.  Respiratory: Negative for cough, chest tightness, shortness of breath and wheezing.   Cardiovascular: Negative for chest pain.  Gastrointestinal: Negative for abdominal pain.  Genitourinary: Negative for difficulty urinating.  Skin: Negative for rash.  Allergic/Immunologic: Positive for environmental allergies. Negative for food allergies.  Neurological: Negative for dizziness and headaches.   Objective: BP 130/78 (BP Location: Left Arm, Patient Position: Sitting, Cuff Size: Large)   Pulse 71   Temp 98.3 F (36.8 C) (Temporal)   Resp 16   Ht 6\' 8"  (2.032 m)   Wt (!) 366 lb 12.8 oz (166.4 kg)   SpO2 99%   BMI 40.30 kg/m  Body mass index is 40.3 kg/m. Physical Exam  Constitutional: He is oriented to person, place, and time. He appears well-developed and well-nourished.   HENT:  Head: Normocephalic and atraumatic.  Right Ear: External ear normal.  Left Ear: External ear normal.  Nose: Nose normal.  Mouth/Throat: Oropharynx is clear and moist.  Eyes: Conjunctivae and EOM are normal.  Neck: Neck supple.  Cardiovascular: Normal rate, regular rhythm and normal heart sounds. Exam reveals no gallop and no friction rub.  No murmur heard. Pulmonary/Chest: Effort normal and breath sounds normal. He has no wheezes. He has no rales.  Abdominal: Soft.  Neurological: He is alert and oriented to person, place, and time.  Skin: Skin is warm. No rash noted.  Psychiatric: He has a normal mood and affect. His behavior is normal.  Nursing note and vitals reviewed.  Previous notes and tests were reviewed. The plan was reviewed with the patient/family, and all questions/concerned were addressed.  It was my pleasure to see David Gonzalez today and participate in his care. Please feel free to contact me with any questions or concerns.  Sincerely,  Rexene Alberts, DO Allergy & Immunology  [facility] office: [phone] [facility] office: [phone]  [place] office: [phone]

## 2018-12-22 NOTE — Assessment & Plan Note (Signed)
Past history - Noticed some difficulty catching his breath since stopped antihistamines. Apparently this happened in the past and sometimes it flares after eating meals. No history of reflux. No change in diet. 2020 spirometry was normal. Interim history - had some SOB during COVID diagnosis.   May use albuterol rescue inhaler 2 puffs or nebulizer every 4 to 6 hours as needed for shortness of breath, chest tightness, coughing, and wheezing. May use albuterol rescue inhaler 2 puffs 5 to 15 minutes prior to strenuous physical activities. Monitor frequency of use.   Get spirometry at next visit.

## 2018-12-23 ENCOUNTER — Ambulatory Visit (INDEPENDENT_AMBULATORY_CARE_PROVIDER_SITE_OTHER): Payer: Self-pay | Admitting: Family Medicine

## 2018-12-23 ENCOUNTER — Encounter: Payer: Self-pay | Admitting: Family Medicine

## 2018-12-23 DIAGNOSIS — J302 Other seasonal allergic rhinitis: Secondary | ICD-10-CM

## 2018-12-23 DIAGNOSIS — J3089 Other allergic rhinitis: Secondary | ICD-10-CM

## 2018-12-23 DIAGNOSIS — R7303 Prediabetes: Secondary | ICD-10-CM

## 2018-12-23 DIAGNOSIS — I10 Essential (primary) hypertension: Secondary | ICD-10-CM

## 2018-12-23 DIAGNOSIS — J329 Chronic sinusitis, unspecified: Secondary | ICD-10-CM

## 2018-12-23 DIAGNOSIS — E782 Mixed hyperlipidemia: Secondary | ICD-10-CM

## 2018-12-23 DIAGNOSIS — E786 Lipoprotein deficiency: Secondary | ICD-10-CM

## 2018-12-23 MED ORDER — ATORVASTATIN CALCIUM 20 MG PO TABS: 20.0000 mg | ORAL_TABLET | Freq: Every day | ORAL | 0 refills | Status: DC

## 2018-12-23 MED ORDER — HYDROCHLOROTHIAZIDE 12.5 MG PO CAPS: 12.5000 mg | ORAL_CAPSULE | Freq: Every day | ORAL | 0 refills | Status: DC

## 2018-12-23 MED ORDER — LISINOPRIL 20 MG PO TABS: 20.0000 mg | ORAL_TABLET | Freq: Every day | ORAL | 0 refills | Status: DC

## 2018-12-23 NOTE — Progress Notes (Signed)
Virtual / live video office visit note for [employer], D.O- Primary Care Physician at [facility]   I connected with current patient today and beyond visually recognizing the correct individual, I verified that I am speaking with the correct person using two identifiers.   Location of the patient: Home  Location of the provider: Office Only the patient (+/- their family members at pt's discretion) and myself were participating in the encounter    - This visit type was conducted due to national recommendations for restrictions regarding the COVID-19 Pandemic (e.g. social distancing) in an effort to limit this patient's exposure and mitigate transmission in our community.  This format is felt to be most appropriate for this patient at this time.   - The patient did have access to video technology today  - No physical exam could be performed with this format, beyond that communicated to [place] by the patient/ family members as noted.   - Additionally my office staff/ schedulers discussed with the patient that there may be a monetary charge related to this service, depending on patient's medical insurance.   The patient expressed understanding, and agreed to proceed.      History of Present Illness:  States doing "fine now."    Sold their house and are building a house right now; currently living in a single wide with his kids. Maudie Mercury is living with her mom, because pt's MIL is still recovering from [place].  - Recent COVID-19 Infection of Pt and Several Family Members Patient recently had COVID-19.  Tested positive on 10/22/2018.  States his symptoms lasted "probably a week and a half, but then even after that it took me another week and a half to feel normal."  He was having body aches, "really bad body aches," leg pain that woke him up a couple of nights, fatigue, having to catch his breath ("that I'm used to when my sinuses bother me, so if the coronavirus wasn't out there I would have  just assumed my allergies were killing me"); "I think that's about it for me."  Maudie Mercury had "about the same sx, but also had the diarrhea part and change or loss of taste and smell."  His MIL got it at age 20 and was in the hospital for two weeks.  His BIL also got it, and was in the hospital for two days.  States they think they got it from their son, because he went to camp for a couple weeks before that.  They were told that 20-30 people from the church (for the camp) ended up getting it (and his son maybe got it during the bus ride back to the church).  Son Lysbeth Galas turned 16 in July.  His daughter got tested and somehow she didn't get it.  She does live with the family.  BP:  States fine at last checks.  He went to the ER in May for viral pharyngitis and states it was even good while he was in the ER.  Patient is not checking his blood pressure at home.  Hyperlipidemia:  Continues cholesterol meds every night.  Denies concerns.    Depression screen [facility] 2/9 02/22/2018 09/04/2017 08/17/2017 05/11/2017 09/10/2015  Decreased Interest 0 0 0 0 0  Down, Depressed, Hopeless 1 0 0 0 0  PHQ - 2 Score 1 0 0 0 0  Altered sleeping 0 0 0 0 -  Tired, decreased energy 0 0 0 0 -  Change in  appetite 0 0 0 0 -  Feeling bad or failure about yourself  0 0 0 0 -  Trouble concentrating 0 0 0 0 -  Moving slowly or fidgety/restless 0 0 0 0 -  Suicidal thoughts 0 0 0 0 -  PHQ-9 Score 1 0 0 0 -  Difficult doing work/chores Not difficult at all Not difficult at all Not difficult at all Not difficult at all -    No flowsheet data found.   Impression and Recommendations:    1. Essential hypertension   2. Prediabetes   3. Mixed hyperlipidemia   4. Low HDL (under 40)   5. Morbid obesity ([place])   6. Chronic sinusitis, unspecified location   7. Seasonal and perennial allergic rhinitis     Chronic Sinusitis, Seasonal & Perennial Allergic Rhinitis - Stable at this time on current management. - Continue treatment plan  as prescribed.  See med list. - Patient tolerating meds well without complication.  Denies S-E  - Pt established with specialist & last went to allergist yesterday. - Sees Dr. Rexene Alberts. - Allergist will continue to manage treatment plan.  - Will continue to monitor.  Mixed Hyperlipidemia, Low HDL - Continue management as established.  See med list. - Patient tolerating meds well without complication.  Denies S-E  - Will continue to monitor. - Need for recheck in near future, 2-3 months.  Essential Hypertension - BP stable at this time. - At goal of under 130/80 or less. - Continue treatment plan as established. See med list below. - Patient tolerating meds well without complication.  Denies S-E - Encouraged patient to begin ambulatory BP monitoring. - Discussed cuffs to purchase.  - Will continue to monitor.  BMI Counseling - BMI of 40.30, Morbid Obesity American Heart Association guidelines for healthy diet, basically Mediterranean diet, and exercise guidelines of 30 minutes 5 days per week or more discussed in detail.  Health counseling performed.  All questions answered.  Health Counseling & Preventative Health Maintenance - Advised patient to continue working toward exercising to improve overall mental, physical, and emotional health.    - Reviewed the "spokes of the wheel" of mood and health management.  Stressed the importance of ongoing prudent habits, including regular exercise, appropriate sleep hygiene, healthful dietary habits, and prayer/meditation to relax.  - Encouraged patient to engage in daily physical activity, especially a formal exercise routine.  Recommended that the patient eventually strive for at least 150 minutes of moderate cardiovascular activity per week according to guidelines established by the [facility].   - Healthy dietary habits encouraged, including low-carb, and high amounts of lean protein in diet.   - Patient should also consume adequate amounts  of water.  Recommendations - Need for CPE and full fasting lab work in near future.   - As part of my medical decision making, I reviewed the following data within the [place] History obtained from pt /family, CMA notes reviewed and incorporated if applicable, Labs reviewed, Radiograph/ tests reviewed if applicable and OV notes from prior OV's with me, as well as other specialists she/he has seen since seeing me last, were all reviewed and used in my medical decision making process today.   - Additionally, discussion had with patient regarding txmnt plan, their biases about that plan etc were used in my medical decision making today.   - The patient agreed with the plan and demonstrated an understanding of the instructions.   No barriers to understanding were identified.   -  Red flag symptoms and signs discussed in detail.  Patient expressed understanding regarding what to do in case of emergency\ urgent symptoms.  The patient was advised to call back or seek an in-person evaluation if the symptoms worsen or if the condition fails to improve as anticipated.   No follow-ups on file.    No orders of the defined types were placed in this encounter.   Meds ordered this encounter  Medications   hydrochlorothiazide (MICROZIDE) 12.5 MG capsule    Sig: Take 1 capsule (12.5 mg total) by mouth daily.    Dispense:  90 capsule    Refill:  0   lisinopril (ZESTRIL) 20 MG tablet    Sig: Take 1 tablet (20 mg total) by mouth daily.    Dispense:  90 tablet    Refill:  0   atorvastatin (LIPITOR) 20 MG tablet    Sig: Take 1 tablet (20 mg total) by mouth at bedtime.    Dispense:  90 tablet    Refill:  0    Medications Discontinued During This Encounter  Medication Reason   hydrochlorothiazide (MICROZIDE) 12.5 MG capsule Reorder   lisinopril (PRINIVIL,ZESTRIL) 20 MG tablet Reorder   atorvastatin (LIPITOR) 20 MG tablet Reorder     Note:  This note was prepared with  assistance of Dragon voice recognition software. Occasional wrong-word or sound-a-like substitutions may have occurred due to the inherent limitations of voice recognition software.  This document serves as a record of services personally performed by Mellody Dance, DO. It was created on her behalf by Toni Amend, a trained medical scribe. The creation of this record is based on the scribe's personal observations and the provider's statements to them.   I have reviewed the above medical documentation for accuracy and completeness and I concur.  Mellody Dance, DO 12/27/2018 9:22 AM    Patient Care Team    Relationship Specialty Notifications Start End  Mellody Dance, DO PCP - General Family Medicine  09/10/15   Dermatology, [facility]    08/17/17    Comment: Goes for yearly skin screenings  Garnet Sierras, DO Consulting Physician Allergy  12/22/18     -Vitals obtained; medications/ allergies reconciled;  personal medical, social, Sx etc.histories were updated by CMA, reviewed by me and are reflected in chart  Patient Active Problem List   Diagnosis Date Noted   Seasonal and perennial allergic rhinitis 12/22/2018   Allergic conjunctivitis of both eyes 12/22/2018   Other allergic rhinitis 06/21/2018   Shortness of breath 06/21/2018   Chronic rhinitis 04/19/2018   Tinnitus of right ear 02/22/2018   Hearing disorder of both ears 09/04/2017   Chronic sinusitis 09/04/2017   Dysfunction of eustachian tube 09/04/2017   No-show for appointment 04/12/2016   Prediabetes 10/15/2015   Low HDL (under 40) 10/15/2015   Vitamin D insufficiency 10/15/2015   mild occ Fatigue 10/08/2015   Environmental and seasonal allergies 09/11/2015   Mixed hyperlipidemia 09/11/2015   Morbid obesity ([place]) 09/11/2015   Callus of foot 09/11/2015   Essential hypertension 03/08/2014     Current Meds  Medication Sig   albuterol (VENTOLIN HFA) 108 (90 Base) MCG/ACT inhaler Inhale 1-2  puffs into the lungs every 4 (four) hours as needed for wheezing or shortness of breath.   aspirin 81 MG tablet Take 81 mg by mouth daily.   atorvastatin (LIPITOR) 20 MG tablet Take 1 tablet (20 mg total) by mouth at bedtime.   cetirizine (ZYRTEC) 5 MG tablet Take 1 tablet by  mouth daily.   Cholecalciferol (VITAMIN D3) 5000 units CAPS Take 1 capsule by mouth daily.   hydrochlorothiazide (MICROZIDE) 12.5 MG capsule Take 1 capsule (12.5 mg total) by mouth daily.   lisinopril (ZESTRIL) 20 MG tablet Take 1 tablet (20 mg total) by mouth daily.   mometasone (NASONEX) 50 MCG/ACT nasal spray Place 2 sprays into the nose daily.   Olopatadine HCl (PAZEO) 0.7 % SOLN Place 1 drop into both eyes daily as needed.   [DISCONTINUED] atorvastatin (LIPITOR) 20 MG tablet Take 1 tablet (20 mg total) by mouth at bedtime. Needs office visit for refills   [DISCONTINUED] hydrochlorothiazide (MICROZIDE) 12.5 MG capsule Take 1 capsule (12.5 mg total) by mouth daily.   [DISCONTINUED] lisinopril (PRINIVIL,ZESTRIL) 20 MG tablet Take 1 tablet (20 mg total) by mouth daily.     Allergies  Allergen Reactions   Hydrocodone Other (See Comments)    Skin turns red     ROS:  See above HPI for pertinent positives and negatives   Objective:   There were no vitals taken for this visit.  (if some vitals are omitted, this means that patient was UNABLE to obtain them even though they were asked to get them prior to OV today.  They were asked to call us at their earliest convenience with these once obtained.)  General: A & O * 3; visually in no acute distress; in usual state of health.  Skin: Visible skin appears normal and pt's usual skin color HEENT:  EOMI, head is normocephalic and atraumatic.  Sclera are anicteric. Neck has a good range of motion.  Lips are noncyanotic Chest: normal chest excursion and movement Respiratory: speaking in full sentences, no conversational dyspnea; no use of accessory  muscles Psych: insight good, mood- appears full

## 2019-01-22 ENCOUNTER — Encounter (INDEPENDENT_AMBULATORY_CARE_PROVIDER_SITE_OTHER): Payer: Self-pay

## 2019-01-30 ENCOUNTER — Other Ambulatory Visit: Payer: Self-pay | Admitting: Allergy

## 2019-03-15 ENCOUNTER — Other Ambulatory Visit: Payer: Self-pay | Admitting: Family Medicine

## 2019-03-15 DIAGNOSIS — E559 Vitamin D deficiency, unspecified: Secondary | ICD-10-CM

## 2019-03-15 DIAGNOSIS — E786 Lipoprotein deficiency: Secondary | ICD-10-CM

## 2019-03-15 DIAGNOSIS — R7303 Prediabetes: Secondary | ICD-10-CM

## 2019-03-15 DIAGNOSIS — I10 Essential (primary) hypertension: Secondary | ICD-10-CM

## 2019-03-15 DIAGNOSIS — E782 Mixed hyperlipidemia: Secondary | ICD-10-CM

## 2019-03-15 NOTE — Progress Notes (Signed)
Fasting labs ordered for future CPE

## 2019-03-28 ENCOUNTER — Ambulatory Visit: Payer: Self-pay | Admitting: Family Medicine

## 2019-04-03 ENCOUNTER — Other Ambulatory Visit: Payer: Self-pay | Admitting: Family Medicine

## 2019-04-03 DIAGNOSIS — I10 Essential (primary) hypertension: Secondary | ICD-10-CM

## 2019-04-04 ENCOUNTER — Other Ambulatory Visit: Payer: BC Managed Care – PPO

## 2019-04-04 ENCOUNTER — Other Ambulatory Visit: Payer: Self-pay

## 2019-04-04 DIAGNOSIS — R7303 Prediabetes: Secondary | ICD-10-CM

## 2019-04-04 DIAGNOSIS — E786 Lipoprotein deficiency: Secondary | ICD-10-CM

## 2019-04-04 DIAGNOSIS — I10 Essential (primary) hypertension: Secondary | ICD-10-CM

## 2019-04-04 DIAGNOSIS — E782 Mixed hyperlipidemia: Secondary | ICD-10-CM

## 2019-04-04 DIAGNOSIS — E559 Vitamin D deficiency, unspecified: Secondary | ICD-10-CM

## 2019-04-05 LAB — LIPID PANEL
Chol/HDL Ratio: 3.7 ratio (ref 0.0–5.0)
Cholesterol, Total: 136 mg/dL (ref 100–199)
HDL: 37 mg/dL — ABNORMAL LOW (ref >39)
LDL Chol Calc (NIH): 80 mg/dL (ref 0–99)
Triglycerides: 99 mg/dL (ref 0–149)
VLDL Cholesterol Cal: 19 mg/dL (ref 5–40)

## 2019-04-05 LAB — CBC WITH DIFFERENTIAL/PLATELET
Basophils Absolute: 0.1 10*3/uL (ref 0.0–0.2)
Basos: 1 %
EOS (ABSOLUTE): 0.1 10*3/uL (ref 0.0–0.4)
Eos: 2 %
Hematocrit: 41.7 % (ref 37.5–51.0)
Hemoglobin: 14.2 g/dL (ref 13.0–17.7)
Immature Grans (Abs): 0 10*3/uL (ref 0.0–0.1)
Immature Granulocytes: 1 %
Lymphocytes Absolute: 1.5 10*3/uL (ref 0.7–3.1)
Lymphs: 21 %
MCH: 31.9 pg (ref 26.6–33.0)
MCHC: 34.1 g/dL (ref 31.5–35.7)
MCV: 94 fL (ref 79–97)
Monocytes Absolute: 0.5 10*3/uL (ref 0.1–0.9)
Monocytes: 7 %
Neutrophils Absolute: 5 10*3/uL (ref 1.4–7.0)
Neutrophils: 68 %
Platelets: 193 10*3/uL (ref 150–450)
RBC: 4.45 x10E6/uL (ref 4.14–5.80)
RDW: 11.9 % (ref 11.6–15.4)
WBC: 7.2 10*3/uL (ref 3.4–10.8)

## 2019-04-05 LAB — COMPREHENSIVE METABOLIC PANEL
ALT: 23 IU/L (ref 0–44)
AST: 24 IU/L (ref 0–40)
Albumin/Globulin Ratio: 1.7 (ref 1.2–2.2)
Albumin: 4.3 g/dL (ref 4.0–5.0)
Alkaline Phosphatase: 88 IU/L (ref 39–117)
BUN/Creatinine Ratio: 18 (ref 9–20)
BUN: 17 mg/dL (ref 6–24)
Bilirubin Total: 0.6 mg/dL (ref 0.0–1.2)
CO2: 28 mmol/L (ref 20–29)
Calcium: 9.7 mg/dL (ref 8.7–10.2)
Chloride: 104 mmol/L (ref 96–106)
Creatinine, Ser: 0.97 mg/dL (ref 0.76–1.27)
GFR calc Af Amer: 106 mL/min/{1.73_m2} (ref >59)
GFR calc non Af Amer: 92 mL/min/{1.73_m2} (ref >59)
Globulin, Total: 2.5 g/dL (ref 1.5–4.5)
Glucose: 111 mg/dL — ABNORMAL HIGH (ref 65–99)
Potassium: 4.4 mmol/L (ref 3.5–5.2)
Sodium: 142 mmol/L (ref 134–144)
Total Protein: 6.8 g/dL (ref 6.0–8.5)

## 2019-04-05 LAB — VITAMIN D 25 HYDROXY (VIT D DEFICIENCY, FRACTURES): Vit D, 25-Hydroxy: 38.3 ng/mL (ref 30.0–100.0)

## 2019-04-05 LAB — T4, FREE: Free T4: 1.15 ng/dL (ref 0.82–1.77)

## 2019-04-05 LAB — HEMOGLOBIN A1C
Est. average glucose Bld gHb Est-mCnc: 114 mg/dL
Hgb A1c MFr Bld: 5.6 % (ref 4.8–5.6)

## 2019-04-05 LAB — TSH: TSH: 1.27 u[IU]/mL (ref 0.450–4.500)

## 2019-04-11 ENCOUNTER — Other Ambulatory Visit: Payer: Self-pay

## 2019-04-11 ENCOUNTER — Encounter: Payer: Self-pay | Admitting: Family Medicine

## 2019-04-11 ENCOUNTER — Ambulatory Visit (INDEPENDENT_AMBULATORY_CARE_PROVIDER_SITE_OTHER): Payer: BC Managed Care – PPO | Admitting: Family Medicine

## 2019-04-11 VITALS — BP 130/74 | HR 70 | Temp 98.2°F | Resp 14 | Ht >= 80 in | Wt 358.8 lb

## 2019-04-11 DIAGNOSIS — E559 Vitamin D deficiency, unspecified: Secondary | ICD-10-CM

## 2019-04-11 DIAGNOSIS — R7303 Prediabetes: Secondary | ICD-10-CM

## 2019-04-11 DIAGNOSIS — E782 Mixed hyperlipidemia: Secondary | ICD-10-CM | POA: Diagnosis not present

## 2019-04-11 DIAGNOSIS — E786 Lipoprotein deficiency: Secondary | ICD-10-CM

## 2019-04-11 DIAGNOSIS — I10 Essential (primary) hypertension: Secondary | ICD-10-CM

## 2019-04-11 DIAGNOSIS — Z719 Counseling, unspecified: Secondary | ICD-10-CM | POA: Diagnosis not present

## 2019-04-11 DIAGNOSIS — Z0000 Encounter for general adult medical examination without abnormal findings: Secondary | ICD-10-CM | POA: Diagnosis not present

## 2019-04-11 MED ORDER — LISINOPRIL 20 MG PO TABS: 20.0000 mg | ORAL_TABLET | Freq: Every day | ORAL | 0 refills | Status: DC

## 2019-04-11 MED ORDER — ATORVASTATIN CALCIUM 20 MG PO TABS: 20.0000 mg | ORAL_TABLET | Freq: Every day | ORAL | 0 refills | Status: DC

## 2019-04-11 MED ORDER — HYDROCHLOROTHIAZIDE 12.5 MG PO CAPS: 12.5000 mg | ORAL_CAPSULE | Freq: Every day | ORAL | 0 refills | Status: DC

## 2019-04-11 NOTE — Progress Notes (Signed)
Male physical  Impression and Recommendations:    1. Encounter for wellness examination   2. Health education/counseling   3. Morbid obesity ([place])   4. Mixed hyperlipidemia   5. Low HDL (under 40)   6. Prediabetes   7. Essential hypertension   8. Vitamin D insufficiency     1) Anticipatory Guidance: Discussed importance of wearing a seatbelt while driving, not texting while driving;   sunscreen when outside along with skin surveillance; eating a balanced and modest diet; physical activity at least 25 minutes per day or 150 min/ week moderate to intense activity.  - Reviewed self skin screening with patient today.  Discussed importance of prudent skin surveillance and ongoing follow-up with dermatology yearly given his family history of skin cancer in father.  - Reviewed prudent self-testicular screening with patient today.  Discussed importance of performing self-testicular exams monthly.  - To help prevent hemorrhoids, encouraged patient to hydrate and keep his stools soft, utilizing preparation H over the counter if any hemorrhoid becomes inflamed / as-needed.  2) Immunizations / Screenings / Labs:   All immunizations are up-to-date per recommendations or will be updated today. Patient is due for dental and vision screens which pt will schedule independently. Will obtain CBC, CMP, HgA1c, Lipid panel, TSH and vit D when fasting, if not already done recently.   - Lab work recently obtained.  - Recommended establishing with eye doctor at least every two years, ideally once yearly.  3) Weight - Body mass index is 39.42 kg/m BMI meaning discussed with patient.  Discussed goal of losing 5-10% of current body weight which would improve overall feelings of well being and improve objective health data. Improve nutrient density of diet through increasing intake of fruits and vegetables and decreasing saturated fats, white flour products and refined sugars.   - Advised patient to  continue working toward exercising and weight loss to improve overall mental, physical, and emotional health.    - Encouraged patient to engage in daily physical activity, especially a formal exercise routine.  Recommended that the patient eventually strive for at least 150 minutes of moderate cardiovascular activity per week according to guidelines established by the [facility].   - Healthy dietary habits encouraged, including low-carb, and high amounts of lean protein in diet.   - Patient should also consume adequate amounts of water.  - Health counseling performed.  All questions answered.   Meds ordered this encounter  Medications  . hydrochlorothiazide (MICROZIDE) 12.5 MG capsule    Sig: Take 1 capsule (12.5 mg total) by mouth daily.    Dispense:  90 capsule    Refill:  0  . lisinopril (ZESTRIL) 20 MG tablet    Sig: Take 1 tablet (20 mg total) by mouth daily.    Dispense:  90 tablet    Refill:  0  . DISCONTD: atorvastatin (LIPITOR) 20 MG tablet    Sig: Take 1 tablet (20 mg total) by mouth at bedtime.    Dispense:  90 tablet    Refill:  0    Return Please MyChart message [place] in 2 weeks with your home BPs and pulses, for if BP not at goal <135/85 on regular basis, follow-up within 3-4 weeks, OTHERWISE, if BP WNL's- f/up 3-70mo.   Reminded pt important of f-up preventative CPE in 1 year.  Reminded pt again, this is in addition to any chronic care visits.    Gross side effects, risk and benefits, and alternatives of medications  discussed with patient.  Patient is aware that all medications have potential side effects and we are unable to predict every side effect or drug-drug interaction that may occur.  Expresses verbal understanding and consents to current therapy plan and treatment regimen.  Please see AVS handed out to patient at the end of our visit for further patient instructions/ counseling done pertaining to today's office visit.  This document serves as a record of services  personally performed by Mellody Dance, DO. It was created on her behalf by Toni Amend, a trained medical scribe. The creation of this record is based on the scribe's personal observations and the provider's statements to them.   This case required medical decision making of at least moderate complexity. The above documentation has been reviewed to be accurate and was completed by Marjory Sneddon, D.O.      Subjective:    I, Toni Amend, am serving as scribe for Dr. Mellody Dance.  CC: CPE  HPI: MYLIK WOJTON is a 49 y.o. male who presents to [place] [facility] today for a yearly health maintenance exam.     Health Maintenance Summary Reviewed and updated, unless pt declines services.  Colonoscopy:  Denies family history of colon cancer. Tobacco History Reviewed:  Never smoker; notes "a little as a teenager." Alcohol:  No concerns, no excessive use. Exercise Habits:  Not meeting AHA guidelines.  Encouraged patient to work on getting outside and walk around daily. STD concerns:  None reported. Drug Use:  None reported. Birth control method:  None reported. Testicular/penile concerns:  Denies concerns.  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  - Dental Health Typically goes to the dentist every six months.  - History of Prostate Cancer in Father Per patient, dad had prostate cancer at age 32.  Denies signs of prostate such as urinary frequency, urinary hesitancy, or concerns with urinary stream.  Denies concerns with urination in general.  - Visual Health Hasn't gone to an eye doctor since his 20's.  - Dermatological Health Denies changes to moles on body; goes to dermatology once yearly and last went in April of 2020.  Notes family history of skin cancer in father.  - General Health Denies concerns with digestion, nausea, vomiting, diarrhea.   Immunization History    Administered Date(s) Administered  . Tdap 10/08/2015    Health Maintenance  Topic Date Due  . INFLUENZA VACCINE  06/29/2019 (Originally 10/30/2018)  . HIV Screening  04/10/2020 (Originally [birth year]/1985)  . TETANUS/TDAP  10/07/2025      Wt Readings from Last 3 Encounters:  04/11/19 (!) 358 lb 12.8 oz (162.8 kg)  12/22/18 (!) 366 lb 12.8 oz (166.4 kg)  08/24/18 (!) 355 lb (161 kg)   BP Readings from Last 3 Encounters:  04/11/19 130/74  12/22/18 130/78  08/24/18 119/67   Pulse Readings from Last 3 Encounters:  04/11/19 70  12/22/18 71  08/24/18 76    Patient Active Problem List   Diagnosis Date Noted  . Prediabetes 10/15/2015  . Low HDL (under 40) 10/15/2015  . Mixed hyperlipidemia 09/11/2015  . Morbid obesity ([place]) 09/11/2015  . Essential hypertension 03/08/2014  . Tinnitus of right ear 02/22/2018  . Chronic sinusitis 09/04/2017  . Seasonal and perennial allergic rhinitis 12/22/2018  . Allergic conjunctivitis of both eyes 12/22/2018  . Other allergic rhinitis 06/21/2018  . Shortness of breath 06/21/2018  . Chronic rhinitis  04/19/2018  . Hearing disorder of both ears 09/04/2017  . Dysfunction of eustachian tube 09/04/2017  . No-show for appointment 04/12/2016  . Vitamin D insufficiency 10/15/2015  . mild occ Fatigue 10/08/2015  . Environmental and seasonal allergies 09/11/2015  . Callus of foot 09/11/2015    Past Medical History:  Diagnosis Date  . Complication of anesthesia    reported to be violent waking from anesthesia at age 24  . Headache(784.0)   . Mental disorder    mildly claustrophobic    Past Surgical History:  Procedure Laterality Date  . ANTERIOR CERVICAL DECOMP/DISCECTOMY FUSION  04/30/2011   Procedure: ANTERIOR CERVICAL DECOMPRESSION/DISCECTOMY FUSION 1 LEVEL;  Surgeon: Dahlia Bailiff, MD;  Location: [place];  Service: Orthopedics;  Laterality: N/A;  ACDF C5-6  . KNEE ARTHROSCOPY    . TONSILLECTOMY      Family History  Problem Relation Age  of Onset  . Diabetes Mother   . Hypertension Mother   . Arthritis Mother   . Neuropathy Mother   . Hypertension Father   . Cancer Father   . Hypertension Sister   . Hypertension Maternal Grandmother   . Schizophrenia Maternal Grandfather   . Hypertension Paternal Grandmother   . Hypertension Paternal Grandfather     Social History   Substance and Sexual Activity  Drug Use No  ,  Social History   Substance and Sexual Activity  Alcohol Use No  ,  Social History   Tobacco Use  Smoking Status Never Smoker  Smokeless Tobacco Never Used  ,  Social History   Substance and Sexual Activity  Sexual Activity Yes  . Birth control/protection: Pill    Patient's Medications  New Prescriptions   No medications on file  Previous Medications   ALBUTEROL (VENTOLIN HFA) 108 (90 BASE) MCG/ACT INHALER    Inhale 1-2 puffs into the lungs every 4 (four) hours as needed for wheezing or shortness of breath.   ASPIRIN 81 MG TABLET    Take 81 mg by mouth daily.   CETIRIZINE (ZYRTEC) 5 MG TABLET    Take 1 tablet by mouth daily.   CHOLECALCIFEROL (VITAMIN D3) 5000 UNITS CAPS    Take 1 capsule by mouth daily.   MOMETASONE (NASONEX) 50 MCG/ACT NASAL SPRAY    PLACE 2 SPRAYS INTO THE NOSE DAILY.   OLOPATADINE HCL (PAZEO) 0.7 % SOLN    Place 1 drop into both eyes daily as needed.  Modified Medications   Modified Medication Previous Medication   ATORVASTATIN (LIPITOR) 20 MG TABLET atorvastatin (LIPITOR) 20 MG tablet      TAKE 1 TABLET BY MOUTH EVERYDAY AT BEDTIME    Take 1 tablet (20 mg total) by mouth at bedtime.   HYDROCHLOROTHIAZIDE (MICROZIDE) 12.5 MG CAPSULE hydrochlorothiazide (MICROZIDE) 12.5 MG capsule      Take 1 capsule (12.5 mg total) by mouth daily.    Take 1 capsule (12.5 mg total) by mouth daily.   LISINOPRIL (ZESTRIL) 20 MG TABLET lisinopril (ZESTRIL) 20 MG tablet      Take 1 tablet (20 mg total) by mouth daily.    TAKE 1 TABLET BY MOUTH EVERY DAY  Discontinued Medications   No  medications on file    Hydrocodone  Review of Systems: General:   Denies fever, chills, unexplained weight loss.  Optho/Auditory:   Denies visual changes, blurred vision/LOV Respiratory:   Denies SOB, DOE more than baseline levels.   Cardiovascular:   Denies chest pain, palpitations, new onset peripheral edema  Gastrointestinal:  Denies nausea, vomiting, diarrhea.  Genitourinary: Denies dysuria, freq/ urgency, flank pain or discharge from genitals.  Endocrine:     Denies hot or cold intolerance, polyuria, polydipsia. Musculoskeletal:   Denies unexplained myalgias, joint swelling, unexplained arthralgias, gait problems.  Skin:  Denies rash, suspicious lesions Neurological:     Denies dizziness, unexplained weakness, numbness  Psychiatric/Behavioral:   Denies mood changes, suicidal or homicidal ideations, hallucinations    Objective:     Blood pressure 130/74, pulse 70, temperature 98.2 F (36.8 C), temperature source Oral, resp. rate 14, height 6\' 8"  (2.032 m), weight (!) 358 lb 12.8 oz (162.8 kg), SpO2 98 %. Body mass index is 39.42 kg/m. General Appearance:    Alert, cooperative, no distress, appears stated age  Head:    Normocephalic, without obvious abnormality, atraumatic  Eyes:    PERRL, conjunctiva/corneas clear, EOM's intact, fundi    benign, both eyes  Ears:    Normal TM's and external ear canals, both ears  Nose:   Nares normal, septum midline, mucosa normal, no drainage    or sinus tenderness  Throat:   Lips w/o lesion, mucosa moist, and tongue normal; teeth and   gums normal  Neck:   Supple, symmetrical, trachea midline, no adenopathy;    thyroid:  no enlargement/tenderness/nodules; no carotid   bruit or JVD  Back:     Symmetric, no curvature, ROM normal, no CVA tenderness  Lungs:     Clear to auscultation bilaterally, respirations unlabored, no       Wh/ R/ R  Chest Wall:    No tenderness or gross deformity; normal excursion   Heart:    Regular rate and rhythm,  S1 and S2 normal, no murmur, rub   or gallop  Abdomen:     Soft, non-tender, bowel sounds active all four quadrants, NO   G/R/R, no masses, no organomegaly  Genitalia:    Ext genitalia: without lesion, no penile rash or discharge, no hernias appreciated   Rectal:     external hemorrhoid appreciated-no erythema no thrombosis or evidence of strangulation.  Normal tone, prostate WNL's and equal b/l, no tenderness; guaiac negative stool  Extremities:   Extremities normal, atraumatic, no cyanosis or gross edema  Pulses:   2+ and symmetric all extremities  Skin:   Warm, dry, Skin color, texture, turgor normal, no obvious rashes or lesions  M-Sk:   Ambulates * 4 w/o difficulty, no gross deformities, tone WNL  Neurologic:   CNII-XII intact, normal strength, sensation and reflexes    Throughout Psych:  No HI/SI, judgement and insight good, Euthymic mood. Full Affect.

## 2019-04-11 NOTE — Patient Instructions (Addendum)
Hypertension, Adult High blood pressure (hypertension) is when the force of blood pumping through the arteries is too strong. The arteries are the blood vessels that carry blood from the heart throughout the body. Hypertension forces the heart to work harder to pump blood and may cause arteries to become narrow or stiff. Untreated or uncontrolled hypertension can cause a heart attack, heart failure, a stroke, kidney disease, and other problems. A blood pressure reading consists of a higher number over a lower number. Ideally, your blood pressure should be below 120/80. The first ("top") number is called the systolic pressure. It is a measure of the pressure in your arteries as your heart beats. The second ("bottom") number is called the diastolic pressure. It is a measure of the pressure in your arteries as the heart relaxes. What are the causes? The exact cause of this condition is not known. There are some conditions that result in or are related to high blood pressure. What increases the risk? Some risk factors for high blood pressure are under your control. The following factors may make you more likely to develop this condition:  Smoking.  Having type 2 diabetes mellitus, high cholesterol, or both.  Not getting enough exercise or physical activity.  Being overweight.  Having too much fat, sugar, calories, or salt (sodium) in your diet.  Drinking too much alcohol. Some risk factors for high blood pressure may be difficult or impossible to change. Some of these factors include:  Having chronic kidney disease.  Having a family history of high blood pressure.  Age. Risk increases with age.  Race. You may be at higher risk if you are African American.  Gender. Men are at higher risk than women before age 57. After age 71, women are at higher risk than men.  Having obstructive sleep apnea.  Stress. What are the signs or symptoms? High blood pressure may not cause symptoms. Very  high blood pressure (hypertensive crisis) may cause:  Headache.  Anxiety.  Shortness of breath.  Nosebleed.  Nausea and vomiting.  Vision changes.  Severe chest pain.  Seizures. How is this diagnosed? This condition is diagnosed by measuring your blood pressure while you are seated, with your arm resting on a flat surface, your legs uncrossed, and your feet flat on the floor. The cuff of the blood pressure monitor will be placed directly against the skin of your upper arm at the level of your heart. It should be measured at least twice using the same arm. Certain conditions can cause a difference in blood pressure between your right and left arms. Certain factors can cause blood pressure readings to be lower or higher than normal for a short period of time:  When your blood pressure is higher when you are in a health care provider's office than when you are at home, this is called white coat hypertension. Most people with this condition do not need medicines.  When your blood pressure is higher at home than when you are in a health care provider's office, this is called masked hypertension. Most people with this condition may need medicines to control blood pressure. If you have a high blood pressure reading during one visit or you have normal blood pressure with other risk factors, you may be asked to:  Return on a different day to have your blood pressure checked again.  Monitor your blood pressure at home for 1 week or longer. If you are diagnosed with hypertension, you may have other blood or  imaging tests to help your health care provider understand your overall risk for other conditions. How is this treated? This condition is treated by making healthy lifestyle changes, such as eating healthy foods, exercising more, and reducing your alcohol intake. Your health care provider may prescribe medicine if lifestyle changes are not enough to get your blood pressure under control, and  if:  Your systolic blood pressure is above 130.  Your diastolic blood pressure is above 80. Your personal target blood pressure may vary depending on your medical conditions, your age, and other factors. Follow these instructions at home: Eating and drinking   Eat a diet that is high in fiber and potassium, and low in sodium, added sugar, and fat. An example eating plan is called the DASH (Dietary Approaches to Stop Hypertension) diet. To eat this way: ? Eat plenty of fresh fruits and vegetables. Try to fill one half of your plate at each meal with fruits and vegetables. ? Eat whole grains, such as whole-wheat pasta, brown rice, or whole-grain bread. Fill about one fourth of your plate with whole grains. ? Eat or drink low-fat dairy products, such as skim milk or low-fat yogurt. ? Avoid fatty cuts of meat, processed or cured meats, and poultry with skin. Fill about one fourth of your plate with lean proteins, such as fish, chicken without skin, beans, eggs, or tofu. ? Avoid pre-made and processed foods. These tend to be higher in sodium, added sugar, and fat.  Reduce your daily sodium intake. Most people with hypertension should eat less than 1,500 mg of sodium a day.  Do not drink alcohol if: ? Your health care provider tells you not to drink. ? You are pregnant, may be pregnant, or are planning to become pregnant.  If you drink alcohol: ? Limit how much you use to:  0-1 drink a day for women.  0-2 drinks a day for men. ? Be aware of how much alcohol is in your drink. In the [place] one drink equals one 12 oz bottle of beer (355 mL), one 5 oz glass of wine (148 mL), or one 1 oz glass of hard liquor (44 mL). Lifestyle   Work with your health care provider to maintain a healthy body weight or to lose weight. Ask what an ideal weight is for you.  Get at least 30 minutes of exercise most days of the week. Activities may include walking, swimming, or biking.  Include exercise to  strengthen your muscles (resistance exercise), such as Pilates or lifting weights, as part of your weekly exercise routine. Try to do these types of exercises for 30 minutes at least 3 days a week.  Do not use any products that contain nicotine or tobacco, such as cigarettes, e-cigarettes, and chewing tobacco. If you need help quitting, ask your health care provider.  Monitor your blood pressure at home as told by your health care provider.  Keep all follow-up visits as told by your health care provider. This is important. Medicines  Take over-the-counter and prescription medicines only as told by your health care provider. Follow directions carefully. Blood pressure medicines must be taken as prescribed.  Do not skip doses of blood pressure medicine. Doing this puts you at risk for problems and can make the medicine less effective.  Ask your health care provider about side effects or reactions to medicines that you should watch for. Contact a health care provider if you:  Think you are having a reaction to a medicine you  are taking.  Have headaches that keep coming back (recurring).  Feel dizzy.  Have swelling in your ankles.  Have trouble with your vision. Get help right away if you:  Develop a severe headache or confusion.  Have unusual weakness or numbness.  Feel faint.  Have severe pain in your chest or abdomen.  Vomit repeatedly.  Have trouble breathing. Summary  Hypertension is when the force of blood pumping through your arteries is too strong. If this condition is not controlled, it may put you at risk for serious complications.  Your personal target blood pressure may vary depending on your medical conditions, your age, and other factors. For most people, a normal blood pressure is less than 120/80.  Hypertension is treated with lifestyle changes, medicines, or a combination of both. Lifestyle changes include losing weight, eating a healthy, low-sodium diet,  exercising more, and limiting alcohol. This information is not intended to replace advice given to you by your health care provider. Make sure you discuss any questions you have with your health care provider. Document Revised: 11/25/2017 Document Reviewed: 11/25/2017 Elsevier Patient Education  [place], Male Preventive care refers to lifestyle choices and visits with your health care provider that can promote health and wellness. What does preventive care include?   A yearly physical exam. This is also called an annual well check.  Dental exams once or twice a year.  Routine eye exams. Ask your health care provider how often you should have your eyes checked.  Personal lifestyle choices, including: ? Daily care of your teeth and gums. ? Regular physical activity. ? Eating a healthy diet. ? Avoiding tobacco and drug use. ? Limiting alcohol use. ? Practicing safe sex. ? Taking low doses of aspirin every day. ? Taking vitamin and mineral supplements as recommended by your health care provider. What happens during an annual well check? The services and screenings done by your health care provider during your annual well check will depend on your age, overall health, lifestyle risk factors, and family history of disease. Counseling Your health care provider may ask you questions about your:  Alcohol use.  Tobacco use.  Drug use.  Emotional well-being.  Home and relationship well-being.  Sexual activity.  Eating habits.  History of falls.  Memory and ability to understand (cognition).  Work and work Statistician. Screening You may have the following tests or measurements:  Height, weight, and BMI.  Blood pressure.  Lipid and cholesterol levels. These may be checked every 5 years, or more frequently if you are over 39 years old.  Skin check.  Lung cancer screening. You may have this screening every year starting at age 68 if you  have a 30-pack-year history of smoking and currently smoke or have quit within the past 15 years.  Colorectal cancer screening. All adults should have this screening starting at age 64 and continuing until age 84. You will have tests every 1-10 years, depending on your results and the type of screening test. People at increased risk should start screening at an earlier age. Screening tests may include: ? Guaiac-based fecal occult blood testing. ? Fecal immunochemical test (FIT). ? Stool DNA test. ? Virtual colonoscopy. ? Sigmoidoscopy. During this test, a flexible tube with a tiny camera (sigmoidoscope) is used to examine your rectum and lower colon. The sigmoidoscope is inserted through your anus into your rectum and lower colon. ? Colonoscopy. During this test, a long, thin, flexible tube with  a tiny camera (colonoscope) is used to examine your entire colon and rectum.  Prostate cancer screening. Recommendations will vary depending on your family history and other risks.  Hepatitis C blood test.  Hepatitis B blood test.  Sexually transmitted disease (STD) testing.  Diabetes screening. This is done by checking your blood sugar (glucose) after you have not eaten for a while (fasting). You may have this done every 1-3 years.  Abdominal aortic aneurysm (AAA) screening. You may need this if you are a current or former smoker.  Osteoporosis. You may be screened starting at age 63 if you are at high risk. Talk with your health care provider about your test results, treatment options, and if necessary, the need for more tests. Vaccines Your health care provider may recommend certain vaccines, such as:  Influenza vaccine. This is recommended every year.  Tetanus, diphtheria, and acellular pertussis (Tdap, Td) vaccine. You may need a Td booster every 10 years.  Varicella vaccine. You may need this if you have not been vaccinated.  Zoster vaccine. You may need this after age 54.  Measles,  mumps, and rubella (MMR) vaccine. You may need at least one dose of MMR if you were born in 1957 or later. You may also need a second dose.  Pneumococcal 13-valent conjugate (PCV13) vaccine. One dose is recommended after age 85.  Pneumococcal polysaccharide (PPSV23) vaccine. One dose is recommended after age 46.  Meningococcal vaccine. You may need this if you have certain conditions.  Hepatitis A vaccine. You may need this if you have certain conditions or if you travel or work in places where you may be exposed to hepatitis A.  Hepatitis B vaccine. You may need this if you have certain conditions or if you travel or work in places where you may be exposed to hepatitis B.  Haemophilus influenzae type b (Hib) vaccine. You may need this if you have certain risk factors. Talk to your health care provider about which screenings and vaccines you need and how often you need them. This information is not intended to replace advice given to you by your health care provider. Make sure you discuss any questions you have with your health care provider. Document Released: 04/13/2015 Document Revised: 05/07/2017 Document Reviewed: 01/16/2015 Elsevier Interactive Patient Education  2019 [place] for Adults, Male A healthy lifestyle and preventive care can promote health and wellness. Preventive health guidelines for men include the following key practices:  A routine yearly physical is a good way to check with your health care provider about your health and preventative screening. It is a chance to share any concerns and updates on your health and to receive a thorough exam.  Visit your dentist for a routine exam and preventative care every 6 months. Brush your teeth twice a day and floss once a day. Good oral hygiene prevents tooth decay and gum disease.  The frequency of eye exams is based on your age, health, family medical history, use of contact lenses, and other  factors. Follow your health care provider's recommendations for frequency of eye exams.  Eat a healthy diet. Foods such as vegetables, fruits, whole grains, low-fat dairy products, and lean protein foods contain the nutrients you need without too many calories. Decrease your intake of foods high in solid fats, added sugars, and salt. Eat the right amount of calories for you. Get information about a proper diet from your health care provider, if necessary.  Regular physical exercise is one of the most important things you can do for your health. Most adults should get at least 150 minutes of moderate-intensity exercise (any activity that increases your heart rate and causes you to sweat) each week. In addition, most adults need muscle-strengthening exercises on 2 or more days a week.  Maintain a healthy weight. The body mass index (BMI) is a screening tool to identify possible weight problems. It provides an estimate of body fat based on height and weight. Your health care provider can find your BMI and can help you achieve or maintain a healthy weight. For adults 20 years and older:  A BMI below 18.5 is considered underweight.  A BMI of 18.5 to 24.9 is normal.  A BMI of 25 to 29.9 is considered overweight.  A BMI of 30 and above is considered obese.  Maintain normal blood lipids and cholesterol levels by exercising and minimizing your intake of saturated fat. Eat a balanced diet with plenty of fruit and vegetables. Blood tests for lipids and cholesterol should begin at age 49 and be repeated every 5 years. If your lipid or cholesterol levels are high, you are over 50, or you are at high risk for heart disease, you may need your cholesterol levels checked more frequently. Ongoing high lipid and cholesterol levels should be treated with medicines if diet and exercise are not working.  If you smoke, find out from your health care provider how to quit. If you do not use tobacco, do not start.  Lung  cancer screening is recommended for adults aged 89-80 years who are at high risk for developing lung cancer because of a history of smoking. A yearly low-dose CT scan of the lungs is recommended for people who have at least a 30-pack-year history of smoking and are a current smoker or have quit within the past 15 years. A pack year of smoking is smoking an average of 1 pack of cigarettes a day for 1 year (for example: 1 pack a day for 30 years or 2 packs a day for 15 years). Yearly screening should continue until the smoker has stopped smoking for at least 15 years. Yearly screening should be stopped for people who develop a health problem that would prevent them from having lung cancer treatment.  If you choose to drink alcohol, do not have more than 2 drinks per day. One drink is considered to be 12 ounces (355 mL) of beer, 5 ounces (148 mL) of wine, or 1.5 ounces (44 mL) of liquor.  Avoid use of street drugs. Do not share needles with anyone. Ask for help if you need support or instructions about stopping the use of drugs.  High blood pressure causes heart disease and increases the risk of stroke. Your blood pressure should be checked at least every 1-2 years. Ongoing high blood pressure should be treated with medicines, if weight loss and exercise are not effective.  If you are 72-65 years old, ask your health care provider if you should take aspirin to prevent heart disease.  Diabetes screening is done by taking a blood sample to check your blood glucose level after you have not eaten for a certain period of time (fasting). If you are not overweight and you do not have risk factors for diabetes, you should be screened once every 3 years starting at age 36. If you are overweight or obese and you are 66-80 years of age, you should be screened for diabetes every year  as part of your cardiovascular risk assessment.  Colorectal cancer can be detected and often prevented. Most routine colorectal cancer  screening begins at the age of 12 and continues through age 60. However, your health care provider may recommend screening at an earlier age if you have risk factors for colon cancer. On a yearly basis, your health care provider may provide home test kits to check for hidden blood in the stool. Use of a small camera at the end of a tube to directly examine the colon (sigmoidoscopy or colonoscopy) can detect the earliest forms of colorectal cancer. Talk to your health care provider about this at age 4, when routine screening begins. Direct exam of the colon should be repeated every 5-10 years through age 23, unless early forms of precancerous polyps or small growths are found.  People who are at an increased risk for hepatitis B should be screened for this virus. You are considered at high risk for hepatitis B if:  You were born in a country where hepatitis B occurs often. Talk with your health care provider about which countries are considered high risk.  Your parents were born in a high-risk country and you have not received a shot to protect against hepatitis B (hepatitis B vaccine).  You have HIV or AIDS.  You use needles to inject street drugs.  You live with, or have sex with, someone who has hepatitis B.  You are a man who has sex with other men (MSM).  You get hemodialysis treatment.  You take certain medicines for conditions such as cancer, organ transplantation, and autoimmune conditions.  Hepatitis C blood testing is recommended for all people born from [birth year] through 1965 and any individual with known risks for hepatitis C.  Practice safe sex. Use condoms and avoid high-risk sexual practices to reduce the spread of sexually transmitted infections (STIs). STIs include gonorrhea, chlamydia, syphilis, trichomonas, herpes, HPV, and human immunodeficiency virus (HIV). Herpes, HIV, and HPV are viral illnesses that have no cure. They can result in disability, cancer, and death.  If you are  a man who has sex with other men, you should be screened at least once per year for:  HIV.  Urethral, rectal, and pharyngeal infection of gonorrhea, chlamydia, or both.  If you are at risk of being infected with HIV, it is recommended that you take a prescription medicine daily to prevent HIV infection. This is called preexposure prophylaxis (PrEP). You are considered at risk if:  You are a man who has sex with other men (MSM) and have other risk factors.  You are a heterosexual man, are sexually active, and are at increased risk for HIV infection.  You take drugs by injection.  You are sexually active with a partner who has HIV.  Talk with your health care provider about whether you are at high risk of being infected with HIV. If you choose to begin PrEP, you should first be tested for HIV. You should then be tested every 3 months for as long as you are taking PrEP.  A one-time screening for abdominal aortic aneurysm (AAA) and surgical repair of large AAAs by ultrasound are recommended for men ages 54 to 21 years who are current or former smokers.  Healthy men should no longer receive prostate-specific antigen (PSA) blood tests as part of routine cancer screening. Talk with your health care provider about prostate cancer screening.  Testicular cancer screening is not recommended for adult males who have no symptoms. Screening includes  self-exam, a health care provider exam, and other screening tests. Consult with your health care provider about any symptoms you have or any concerns you have about testicular cancer.  Use sunscreen. Apply sunscreen liberally and repeatedly throughout the day. You should seek shade when your shadow is shorter than you. Protect yourself by wearing long sleeves, pants, a wide-brimmed hat, and sunglasses year round, whenever you are outdoors.  Once a month, do a whole-body skin exam, using a mirror to look at the skin on your back. Tell your health care provider  about new moles, moles that have irregular borders, moles that are larger than a pencil eraser, or moles that have changed in shape or color.  Stay current with required vaccines (immunizations).  Influenza vaccine. All adults should be immunized every year.  Tetanus, diphtheria, and acellular pertussis (Td, Tdap) vaccine. An adult who has not previously received Tdap or who does not know his vaccine status should receive 1 dose of Tdap. This initial dose should be followed by tetanus and diphtheria toxoids (Td) booster doses every 10 years. Adults with an unknown or incomplete history of completing a 3-dose immunization series with Td-containing vaccines should begin or complete a primary immunization series including a Tdap dose. Adults should receive a Td booster every 10 years.  Varicella vaccine. An adult without evidence of immunity to varicella should receive 2 doses or a second dose if he has previously received 1 dose.  Human papillomavirus (HPV) vaccine. Males aged 11-21 years who have not received the vaccine previously should receive the 3-dose series. Males aged 22-26 years may be immunized. Immunization is recommended through the age of 12 years for any male who has sex with males and did not get any or all doses earlier. Immunization is recommended for any person with an immunocompromised condition through the age of 30 years if he did not get any or all doses earlier. During the 3-dose series, the second dose should be obtained 4-8 weeks after the first dose. The third dose should be obtained 24 weeks after the first dose and 16 weeks after the second dose.  Zoster vaccine. One dose is recommended for adults aged 38 years or older unless certain conditions are present.  Measles, mumps, and rubella (MMR) vaccine. Adults born before [birth year] generally are considered immune to measles and mumps. Adults born in [birth year] or later should have 1 or more doses of MMR vaccine unless there is a  contraindication to the vaccine or there is laboratory evidence of immunity to each of the three diseases. A routine second dose of MMR vaccine should be obtained at least 28 days after the first dose for students attending postsecondary schools, health care workers, or international travelers. People who received inactivated measles vaccine or an unknown type of measles vaccine during 1963-1967 should receive 2 doses of MMR vaccine. People who received inactivated mumps vaccine or an unknown type of mumps vaccine before 1979 and are at high risk for mumps infection should consider immunization with 2 doses of MMR vaccine. Unvaccinated health care workers born before [birth year] who lack laboratory evidence of measles, mumps, or rubella immunity or laboratory confirmation of disease should consider measles and mumps immunization with 2 doses of MMR vaccine or rubella immunization with 1 dose of MMR vaccine.  Pneumococcal 13-valent conjugate (PCV13) vaccine. When indicated, a person who is uncertain of his immunization history and has no record of immunization should receive the PCV13 vaccine. All adults 4 years of age and older  should receive this vaccine. An adult aged 57 years or older who has certain medical conditions and has not been previously immunized should receive 1 dose of PCV13 vaccine. This PCV13 should be followed with a dose of pneumococcal polysaccharide (PPSV23) vaccine. Adults who are at high risk for pneumococcal disease should obtain the PPSV23 vaccine at least 8 weeks after the dose of PCV13 vaccine. Adults older than 49 years of age who have normal immune system function should obtain the PPSV23 vaccine dose at least 1 year after the dose of PCV13 vaccine.  Pneumococcal polysaccharide (PPSV23) vaccine. When PCV13 is also indicated, PCV13 should be obtained first. All adults aged 58 years and older should be immunized. An adult younger than age 59 years who has certain medical conditions should be  immunized. Any person who resides in a nursing home or long-term care facility should be immunized. An adult smoker should be immunized. People with an immunocompromised condition and certain other conditions should receive both PCV13 and PPSV23 vaccines. People with human immunodeficiency virus (HIV) infection should be immunized as soon as possible after diagnosis. Immunization during chemotherapy or radiation therapy should be avoided. Routine use of PPSV23 vaccine is not recommended for American Indians, [place] Natives, or people younger than 65 years unless there are medical conditions that require PPSV23 vaccine. When indicated, people who have unknown immunization and have no record of immunization should receive PPSV23 vaccine. One-time revaccination 5 years after the first dose of PPSV23 is recommended for people aged 19-64 years who have chronic kidney failure, nephrotic syndrome, asplenia, or immunocompromised conditions. People who received 1-2 doses of PPSV23 before age 27 years should receive another dose of PPSV23 vaccine at age 22 years or later if at least 5 years have passed since the previous dose. Doses of PPSV23 are not needed for people immunized with PPSV23 at or after age 74 years.  Meningococcal vaccine. Adults with asplenia or persistent complement component deficiencies should receive 2 doses of quadrivalent meningococcal conjugate (MenACWY-D) vaccine. The doses should be obtained at least 2 months apart. Microbiologists working with certain meningococcal bacteria, [place] recruits, people at risk during an outbreak, and people who travel to or live in countries with a high rate of meningitis should be immunized. A first-year college student up through age 1 years who is living in a residence hall should receive a dose if he did not receive a dose on or after his 16th birthday. Adults who have certain high-risk conditions should receive one or more doses of vaccine.  Hepatitis A  vaccine. Adults who wish to be protected from this disease, have chronic liver disease, work with hepatitis A-infected animals, work in hepatitis A research labs, or travel to or work in countries with a high rate of hepatitis A should be immunized. Adults who were previously unvaccinated and who anticipate close contact with an international adoptee during the first 60 days after arrival in the [place] from a country with a high rate of hepatitis A should be immunized.  Hepatitis B vaccine. Adults should be immunized if they wish to be protected from this disease, are under age 55 years and have diabetes, have chronic liver disease, have had more than one sex partner in the past 6 months, may be exposed to blood or other infectious body fluids, are household contacts or sex partners of hepatitis B positive people, are clients or workers in certain care facilities, or travel to or work in countries with a high rate of hepatitis  B.  Haemophilus influenzae type b (Hib) vaccine. A previously unvaccinated person with asplenia or sickle cell disease or having a scheduled splenectomy should receive 1 dose of Hib vaccine. Regardless of previous immunization, a recipient of a hematopoietic stem cell transplant should receive a 3-dose series 6-12 months after his successful transplant. Hib vaccine is not recommended for adults with HIV infection. Preventive Service / Frequency Ages 89 to 8  Blood pressure check.** / Every 3-5 years.  Lipid and cholesterol check.** / Every 5 years beginning at age 7.  Hepatitis C blood test.** / For any individual with known risks for hepatitis C.  Skin self-exam. / Monthly.  Influenza vaccine. / Every year.  Tetanus, diphtheria, and acellular pertussis (Tdap, Td) vaccine.** / Consult your health care provider. 1 dose of Td every 10 years.  Varicella vaccine.** / Consult your health care provider.  HPV vaccine. / 3 doses over 6 months, if 14 or  younger.  Measles, mumps, rubella (MMR) vaccine.** / You need at least 1 dose of MMR if you were born in 1957 or later. You may also need a second dose.  Pneumococcal 13-valent conjugate (PCV13) vaccine.** / Consult your health care provider.  Pneumococcal polysaccharide (PPSV23) vaccine.** / 1 to 2 doses if you smoke cigarettes or if you have certain conditions.  Meningococcal vaccine.** / 1 dose if you are age 16 to 87 years and a Market researcher living in a residence hall, or have one of several medical conditions. You may also need additional booster doses.  Hepatitis A vaccine.** / Consult your health care provider.  Hepatitis B vaccine.** / Consult your health care provider.  Haemophilus influenzae type b (Hib) vaccine.** / Consult your health care provider. Ages 64 to 23  Blood pressure check.** / Every year.  Lipid and cholesterol check.** / Every 5 years beginning at age 38.  Lung cancer screening. / Every year if you are aged 4-80 years and have a 30-pack-year history of smoking and currently smoke or have quit within the past 15 years. Yearly screening is stopped once you have quit smoking for at least 15 years or develop a health problem that would prevent you from having lung cancer treatment.  Fecal occult blood test (FOBT) of stool. / Every year beginning at age 20 and continuing until age 28. You may not have to do this test if you get a colonoscopy every 10 years.  Flexible sigmoidoscopy** or colonoscopy.** / Every 5 years for a flexible sigmoidoscopy or every 10 years for a colonoscopy beginning at age 29 and continuing until age 30.  Hepatitis C blood test.** / For all people born from [birth year] through 1965 and any individual with known risks for hepatitis C.  Skin self-exam. / Monthly.  Influenza vaccine. / Every year.  Tetanus, diphtheria, and acellular pertussis (Tdap/Td) vaccine.** / Consult your health care provider. 1 dose of Td every 10  years.  Varicella vaccine.** / Consult your health care provider.  Zoster vaccine.** / 1 dose for adults aged 50 years or older.  Measles, mumps, rubella (MMR) vaccine.** / You need at least 1 dose of MMR if you were born in 1957 or later. You may also need a second dose.  Pneumococcal 13-valent conjugate (PCV13) vaccine.** / Consult your health care provider.  Pneumococcal polysaccharide (PPSV23) vaccine.** / 1 to 2 doses if you smoke cigarettes or if you have certain conditions.  Meningococcal vaccine.** / Consult your health care provider.  Hepatitis A vaccine.** / Consult your  health care provider.  Hepatitis B vaccine.** / Consult your health care provider.  Haemophilus influenzae type b (Hib) vaccine.** / Consult your health care provider. Ages 23 and over  Blood pressure check.** / Every year.  Lipid and cholesterol check.**/ Every 5 years beginning at age 62.  Lung cancer screening. / Every year if you are aged 63-80 years and have a 30-pack-year history of smoking and currently smoke or have quit within the past 15 years. Yearly screening is stopped once you have quit smoking for at least 15 years or develop a health problem that would prevent you from having lung cancer treatment.  Fecal occult blood test (FOBT) of stool. / Every year beginning at age 70 and continuing until age 16. You may not have to do this test if you get a colonoscopy every 10 years.  Flexible sigmoidoscopy** or colonoscopy.** / Every 5 years for a flexible sigmoidoscopy or every 10 years for a colonoscopy beginning at age 5 and continuing until age 38.  Hepatitis C blood test.** / For all people born from [birth year] through 1965 and any individual with known risks for hepatitis C.  Abdominal aortic aneurysm (AAA) screening.** / A one-time screening for ages 13 to 58 years who are current or former smokers.  Skin self-exam. / Monthly.  Influenza vaccine. / Every year.  Tetanus, diphtheria, and  acellular pertussis (Tdap/Td) vaccine.** / 1 dose of Td every 10 years.  Varicella vaccine.** / Consult your health care provider.  Zoster vaccine.** / 1 dose for adults aged 60 years or older.  Pneumococcal 13-valent conjugate (PCV13) vaccine.** / 1 dose for all adults aged 35 years and older.  Pneumococcal polysaccharide (PPSV23) vaccine.** / 1 dose for all adults aged 47 years and older.  Meningococcal vaccine.** / Consult your health care provider.  Hepatitis A vaccine.** / Consult your health care provider.  Hepatitis B vaccine.** / Consult your health care provider.  Haemophilus influenzae type b (Hib) vaccine.** / Consult your health care provider. **Family history and personal history of risk and conditions may change your health care provider's recommendations.   This information is not intended to replace advice given to you by your health care provider. Make sure you discuss any questions you have with your health care provider.   Document Released: 05/13/2001 Document Revised: 04/07/2014 Document Reviewed: 08/12/2010 Elsevier Interactive Patient Education [employer].

## 2019-04-14 ENCOUNTER — Other Ambulatory Visit: Payer: Self-pay | Admitting: Family Medicine

## 2019-04-14 DIAGNOSIS — E782 Mixed hyperlipidemia: Secondary | ICD-10-CM

## 2019-05-17 ENCOUNTER — Other Ambulatory Visit: Payer: Self-pay | Admitting: Family Medicine

## 2019-05-17 DIAGNOSIS — I10 Essential (primary) hypertension: Secondary | ICD-10-CM

## 2019-06-11 ENCOUNTER — Ambulatory Visit: Payer: BC Managed Care – PPO | Attending: Internal Medicine

## 2019-06-11 DIAGNOSIS — Z23 Encounter for immunization: Secondary | ICD-10-CM

## 2019-06-11 NOTE — Progress Notes (Signed)
   Covid-19 Vaccination Clinic  Name:  David Gonzalez    MRN: [id number] DOB: May 27, 1970  06/11/2019  [name] was observed post Covid-19 immunization for 15 minutes without incident. He was provided with Vaccine Information Sheet and instruction to access the V-Safe system.   [name] was instructed to call 911 with any severe reactions post vaccine: [name] Kitchen Difficulty breathing  . Swelling of face and throat  . A fast heartbeat  . A bad rash all over body  . Dizziness and weakness   Immunizations Administered    Name Date Dose VIS Date Route   Pfizer COVID-19 Vaccine 06/11/2019  4:21 PM 0.3 mL 03/11/2019 Intramuscular   Manufacturer: [place]   Lot: [id number]   [place]: [id number]

## 2019-06-20 ENCOUNTER — Encounter: Payer: Self-pay | Admitting: Allergy

## 2019-06-20 ENCOUNTER — Ambulatory Visit: Payer: BC Managed Care – PPO | Admitting: Allergy

## 2019-06-20 ENCOUNTER — Other Ambulatory Visit: Payer: Self-pay

## 2019-06-20 VITALS — BP 124/82 | HR 73 | Temp 98.3°F | Resp 16 | Ht >= 80 in | Wt 366.4 lb

## 2019-06-20 DIAGNOSIS — J302 Other seasonal allergic rhinitis: Secondary | ICD-10-CM | POA: Diagnosis not present

## 2019-06-20 DIAGNOSIS — J3089 Other allergic rhinitis: Secondary | ICD-10-CM | POA: Diagnosis not present

## 2019-06-20 DIAGNOSIS — H1010 Acute atopic conjunctivitis, unspecified eye: Secondary | ICD-10-CM | POA: Diagnosis not present

## 2019-06-20 NOTE — Assessment & Plan Note (Signed)
Past history - Perennial rhinitis symptoms for the past 20+ years but now having worsening symptoms including ear fullness the past year. Tried Flonase which caused epistaxis. ENT diagnosed with eustachian tube dysfunction and tinnitus. 2020 immunocap was positive to grass pollen and borderline positive to tree pollen, weed and ragweed. 2020 skin testing showed additional positive to cat and mold. No cats at home. Interim history - using Nasonex prn for nasal congestion which has not improved.  Use saline nasal spray 1-2 times a day.  Use the Nasonex 1 spay per nostril 1-2 times a day for nasal congestion. Stop if having issues with epistaxis.   May use Pazeo 1 drop in each eye as needed daily for itchy/watery eyes.  Get eyes checked.   May use over the counter antihistamines such as Zyrtec (cetirizine), Claritin (loratadine), Allegra (fexofenadine), or Xyzal (levocetirizine) daily as needed.  Continue environmental control measures.  Discussed allergy immunotherapy - patient not interested at this time.

## 2019-06-20 NOTE — Progress Notes (Signed)
Follow Up Note  RE: David Gonzalez MRN: [id number] DOB: April 24, 1970 Date of Office Visit: 06/20/2019  Referring provider: Mellody Dance, DO Primary care provider: Mellody Dance, DO  Chief Complaint: Allergic Rhinitis   History of Present Illness: I had the pleasure of seeing Golan Sego for a follow up visit at the [facility] on 06/20/2019. He is a 49 y.o. male, who is being followed for allergic rhinoconjunctivitis and shortness of breath. His previous allergy office visit was on 12/22/2018 with Dr. Maudie Mercury. Today is a regular follow up visit.  Seasonal and perennial allergic rhinitis Having issues with some nasal congestion. Just moved to a newly constructed home.  Currently on Nasonex 1 spray daily as needed with no nosebleeds. Taking zyrtec daily.  No eye drop use recently.  Did not get eyes checked yet.   Shortness of breath Denies any SOB, coughing, wheezing, chest tightness, nocturnal awakenings, ER/urgent care visits or prednisone use since the last visit.  Breathing is back to baseline. Did not even have to use albuterol inhaler.   Assessment and Plan: Young is a 49 y.o. male with: Seasonal and perennial allergic rhinoconjunctivitis Past history - Perennial rhinitis symptoms for the past 20+ years but now having worsening symptoms including ear fullness the past year. Tried Flonase which caused epistaxis. ENT diagnosed with eustachian tube dysfunction and tinnitus. 2020 immunocap was positive to grass pollen and borderline positive to tree pollen, weed and ragweed. 2020 skin testing showed additional positive to cat and mold. No cats at home. Interim history - using Nasonex prn for nasal congestion which has not improved.  Use saline nasal spray 1-2 times a day.  Use the Nasonex 1 spay per nostril 1-2 times a day for nasal congestion. Stop if having issues with epistaxis.   May use Pazeo 1 drop in each eye as needed daily for itchy/watery  eyes.  Get eyes checked.   May use over the counter antihistamines such as Zyrtec (cetirizine), Claritin (loratadine), Allegra (fexofenadine), or Xyzal (levocetirizine) daily as needed.  Continue environmental control measures.  Discussed allergy immunotherapy - patient not interested at this time.   Return in about 6 months (around 12/21/2019).  Diagnostics: None.  Medication List:  Current Outpatient Medications  Medication Sig Dispense Refill  . albuterol (VENTOLIN HFA) 108 (90 Base) MCG/ACT inhaler Inhale 1-2 puffs into the lungs every 4 (four) hours as needed for wheezing or shortness of breath. 18 g 5  . aspirin 81 MG tablet Take 81 mg by mouth daily.    [name] Kitchen atorvastatin (LIPITOR) 20 MG tablet TAKE 1 TABLET BY MOUTH EVERYDAY AT BEDTIME 90 tablet 0  . cetirizine (ZYRTEC) 5 MG tablet Take 1 tablet by mouth daily.    . Cholecalciferol (VITAMIN D3) 5000 units CAPS Take 1 capsule by mouth daily.    . hydrochlorothiazide (MICROZIDE) 12.5 MG capsule TAKE 1 CAPSULE BY MOUTH EVERY DAY 90 capsule 0  . lisinopril (ZESTRIL) 20 MG tablet Take 1 tablet (20 mg total) by mouth daily. 90 tablet 0  . mometasone (NASONEX) 50 MCG/ACT nasal spray PLACE 2 SPRAYS INTO THE NOSE DAILY. 17 g 5  . Olopatadine HCl (PAZEO) 0.7 % SOLN Place 1 drop into both eyes daily as needed. (Patient not taking: Reported on 04/11/2019) 2.5 mL 5   No current facility-administered medications for this visit.   Allergies: Allergies  Allergen Reactions  . Hydrocodone Other (See Comments)    Skin turns red   I reviewed his past  medical history, social history, family history, and environmental history and no significant changes have been reported from his previous visit.  Review of Systems  Constitutional: Negative for appetite change, chills, fever and unexpected weight change.  HENT: Positive for congestion. Negative for rhinorrhea and sneezing.   Eyes: Negative for itching.  Respiratory: Negative for cough, chest  tightness, shortness of breath and wheezing.   Cardiovascular: Negative for chest pain.  Gastrointestinal: Negative for abdominal pain.  Genitourinary: Negative for difficulty urinating.  Skin: Negative for rash.  Allergic/Immunologic: Positive for environmental allergies. Negative for food allergies.  Neurological: Negative for dizziness and headaches.   Objective: BP 124/82   Pulse 73   Temp 98.3 F (36.8 C) (Temporal)   Resp 16   Ht 6\' 8"  (2.032 m)   Wt (!) 366 lb 6.4 oz (166.2 kg)   SpO2 96%   BMI 40.25 kg/m  Body mass index is 40.25 kg/m. Physical Exam  Constitutional: He is oriented to person, place, and time. He appears well-developed and well-nourished.  HENT:  Head: Normocephalic and atraumatic.  Right Ear: External ear normal.  Left Ear: External ear normal.  Mouth/Throat: Oropharynx is clear and moist.  Blood tinged nasal discharge in right nares.  Eyes: Conjunctivae and EOM are normal.  Cardiovascular: Normal rate, regular rhythm and normal heart sounds. Exam reveals no gallop and no friction rub.  No murmur heard. Pulmonary/Chest: Effort normal and breath sounds normal. He has no wheezes. He has no rales.  Abdominal: Soft.  Musculoskeletal:     Cervical back: Neck supple.  Neurological: He is alert and oriented to person, place, and time.  Skin: Skin is warm. No rash noted.  Psychiatric: He has a normal mood and affect. His behavior is normal.  Nursing note and vitals reviewed.  Previous notes and tests were reviewed. The plan was reviewed with the patient/family, and all questions/concerned were addressed.  It was my pleasure to see David Gonzalez today and participate in his care. Please feel free to contact me with any questions or concerns.  Sincerely,  Rexene Alberts, DO Allergy & Immunology  [facility] office: [phone] [facility] office: [place] office: [phone]

## 2019-06-20 NOTE — Patient Instructions (Addendum)
Seasonal and perennial allergic rhino conjunctivitis  2020 testing positive to grass pollen, tree pollen, weed and ragweed, cat and mold. No cats at home.  Use saline nasal spray 1-2 times a day.  Use the Nasonex 1 spay per nostril 1-2 times a day for nasal congestion. If you get bloody noses stop and let us know.  Nose Bleeds: Nosebleeds are very common.  Site of the bleeding is typically on the septum or at the very front of the nose.  Some of the more common causes are from trauma, inflammation or medication induced. Pinch both nostrils while leaning forward for at least 5 minutes before checking to see if the bleeding has stopped. If bleeding is not controlled within 5-10 minutes apply a cotton ball soaked with oxymetazoline (Afrin) to the bleeding nostril for a few seconds.  Preventative treatment: Apply saline nasal gel in each nostril twice a day for 2 weeks to allow the nasal mucosa to heal Consider using a humidifier in the winter Try to keep your blood pressure as normal as possible (120/80)   May use pazeo 1 drop in each eye as needed daily for itchy/watery eyes.  Get eyes checked.   May use over the counter antihistamines such as Zyrtec (cetirizine), Claritin (loratadine), Allegra (fexofenadine), or Xyzal (levocetirizine) daily as needed.  Continue environmental control measures.  Follow up in 6 months or sooner if needed.

## 2019-06-27 ENCOUNTER — Ambulatory Visit: Payer: BC Managed Care – PPO | Admitting: Allergy

## 2019-07-03 ENCOUNTER — Other Ambulatory Visit: Payer: Self-pay | Admitting: Family Medicine

## 2019-07-03 DIAGNOSIS — I10 Essential (primary) hypertension: Secondary | ICD-10-CM

## 2019-07-03 DIAGNOSIS — E782 Mixed hyperlipidemia: Secondary | ICD-10-CM

## 2019-07-05 ENCOUNTER — Ambulatory Visit: Payer: BC Managed Care – PPO

## 2019-07-11 ENCOUNTER — Ambulatory Visit: Payer: BC Managed Care – PPO | Attending: Internal Medicine

## 2019-07-11 DIAGNOSIS — Z23 Encounter for immunization: Secondary | ICD-10-CM

## 2019-07-11 NOTE — Progress Notes (Signed)
   Covid-19 Vaccination Clinic  Name:  David Gonzalez    MRN: [id number] DOB: [birth year]  07/11/2019  David Gonzalez was observed post Covid-19 immunization for 15 minutes without incident. He was provided with Vaccine Information Sheet and instruction to access the V-Safe system.   David Gonzalez was instructed to call 911 with any severe reactions post vaccine: [name] Kitchen Difficulty breathing  . Swelling of face and throat  . A fast heartbeat  . A bad rash all over body  . Dizziness and weakness   Immunizations Administered    Name Date Dose VIS Date Route   Pfizer COVID-19 Vaccine 07/11/2019  4:50 PM 0.3 mL 03/11/2019 Intramuscular   Manufacturer: [place]   Lot: [id number]   [place]: [id number]

## 2019-08-16 ENCOUNTER — Other Ambulatory Visit: Payer: Self-pay | Admitting: Family Medicine

## 2019-08-16 DIAGNOSIS — I10 Essential (primary) hypertension: Secondary | ICD-10-CM

## 2019-08-24 ENCOUNTER — Telehealth: Payer: Self-pay | Admitting: Physician Assistant

## 2019-08-24 DIAGNOSIS — I10 Essential (primary) hypertension: Secondary | ICD-10-CM

## 2019-08-24 MED ORDER — HYDROCHLOROTHIAZIDE 12.5 MG PO CAPS: 12.5000 mg | ORAL_CAPSULE | Freq: Every day | ORAL | 0 refills | Status: DC

## 2019-08-24 NOTE — Telephone Encounter (Signed)
Please call pt to schedule appt.  No further refills until pt is seen.  T. Jadarion Halbig, CMA  

## 2019-08-24 NOTE — Telephone Encounter (Signed)
Patient called to check status of refill request on HCTZ-- advised [facility] to Dr[facility] Hershal Coria box which she doesn't view since no longer @ [facility] apologized for inconvenience.   --LOV 04/13/2019   ---Forwarding refill request to med asst for :   hydrochlorothiazide (MICROZIDE) 12.5 MG capsule [id number]   Order Details Dose, Route, Frequency: As Directed  Dispense Quantity: 90 capsule Refills: 0       Sig: TAKE 1 CAPSULE BY MOUTH EVERY DAY       [facility] :   [id number] [address], [place] - [place] [phone] (Phone) [phone] (Fax)   --glh

## 2019-09-18 ENCOUNTER — Other Ambulatory Visit: Payer: Self-pay | Admitting: Physician Assistant

## 2019-09-18 DIAGNOSIS — I10 Essential (primary) hypertension: Secondary | ICD-10-CM

## 2019-09-29 ENCOUNTER — Telehealth: Payer: Self-pay | Admitting: Physician Assistant

## 2019-09-29 ENCOUNTER — Other Ambulatory Visit: Payer: Self-pay

## 2019-09-29 DIAGNOSIS — I10 Essential (primary) hypertension: Secondary | ICD-10-CM

## 2019-09-29 NOTE — Telephone Encounter (Signed)
Noted and agree.  Charyl Bigger, CMA

## 2019-09-29 NOTE — Telephone Encounter (Signed)
Pt called questioned why no 90dys Rx sent for his HCTZ ( advised per his 04/21/19 OV )   Return Please MyChart message [place] in 2 weeks with your home BPs and pulses, for if BP not at goal <135/85 on regular basis, follow-up within 3-4 weeks, OTHERWISE, if BP WNL's- f/up 3-14mo.  ( --Do not see any BP updates nor was the Recommended OV appt scheduled.  ----Scheduled pt for provider's 1st available 11/03/19 & suggest pt submit BP recordings for review via Mychart to provider   --Forwarding info to med asst :    --glh

## 2019-11-03 ENCOUNTER — Ambulatory Visit (INDEPENDENT_AMBULATORY_CARE_PROVIDER_SITE_OTHER): Payer: BC Managed Care – PPO | Admitting: Physician Assistant

## 2019-11-03 ENCOUNTER — Encounter: Payer: Self-pay | Admitting: Physician Assistant

## 2019-11-03 ENCOUNTER — Other Ambulatory Visit: Payer: Self-pay

## 2019-11-03 VITALS — BP 127/85 | HR 78 | Temp 98.5°F | Ht >= 80 in | Wt 368.0 lb

## 2019-11-03 DIAGNOSIS — I10 Essential (primary) hypertension: Secondary | ICD-10-CM | POA: Diagnosis not present

## 2019-11-03 DIAGNOSIS — E782 Mixed hyperlipidemia: Secondary | ICD-10-CM

## 2019-11-03 NOTE — Assessment & Plan Note (Signed)
-   BP at goal - Continue current medication regimen. - Encourage ambulatory BP and pulse monitoring, and advised to purchase xlarge size cuff from store/amazon. - Continue DASH diet and good hydration. - Encourage to stay as active as possible.

## 2019-11-03 NOTE — Progress Notes (Signed)
Established Patient Office Visit  Subjective:  Patient ID: David Gonzalez, male    DOB: 27-Aug-1970  Age: 49 y.o. MRN: [id number]  CC:  Chief Complaint  Patient presents with  . Hyperlipidemia  . Hypertension    HPI David Gonzalez presents for follow-up on hypertension hyperlipidemia. Pt reports he is doing well and has no acute concerns today.  HTN: Pt denies chest pain, palpitations, dizziness or increased lower extremity swelling from usual. Taking medication as directed without side effects.  Reports due to his size has a difficult time finding an appropriate blood pressure cuff size but blood pressure when going to other medical appointments have been stable-120-130/70-80. Pt follows a low salt diet. Reports good hydration.  HLD: Pt taking medication as directed without issues. Denies side effects including myalgias and RUQ pain. Reports has increased salad consumption.    Past Medical History:  Diagnosis Date  . Complication of anesthesia    reported to be violent waking from anesthesia at age 63  . Headache(784.0)   . Mental disorder    mildly claustrophobic    Past Surgical History:  Procedure Laterality Date  . ANTERIOR CERVICAL DECOMP/DISCECTOMY FUSION  04/30/2011   Procedure: ANTERIOR CERVICAL DECOMPRESSION/DISCECTOMY FUSION 1 LEVEL;  Surgeon: Dahlia Bailiff, MD;  Location: [facility];  Service: Orthopedics;  Laterality: N/A;  ACDF C5-6  . KNEE ARTHROSCOPY    . TONSILLECTOMY      Family History  Problem Relation Age of Onset  . Diabetes Mother   . Hypertension Mother   . Arthritis Mother   . Neuropathy Mother   . Hypertension Father   . Cancer Father   . Hypertension Sister   . Hypertension Maternal Grandmother   . Schizophrenia Maternal Grandfather   . Hypertension Paternal Grandmother   . Hypertension Paternal Grandfather     Social History   Socioeconomic History  . Marital status: Married    Spouse name: Not on file  . Number of children: Not on  file  . Years of education: Not on file  . Highest education level: Not on file  Occupational History  . Not on file  Tobacco Use  . Smoking status: Never Smoker  . Smokeless tobacco: Never Used  Vaping Use  . Vaping Use: Never used  Substance and Sexual Activity  . Alcohol use: No  . Drug use: No  . Sexual activity: Yes    Birth control/protection: Pill  Other Topics Concern  . Not on file  Social History Narrative  . Not on file   Social Determinants of Health   Financial Resource Strain:   . Difficulty of Paying Living Expenses:   Food Insecurity:   . Worried About Charity fundraiser in the Last Year:   . Arboriculturist in the Last Year:   Transportation Needs:   . Film/video editor (Medical):   [name] Kitchen Lack of Transportation (Non-Medical):   Physical Activity:   . Days of Exercise per Week:   . Minutes of Exercise per Session:   Stress:   . Feeling of Stress :   Social Connections:   . Frequency of Communication with Friends and Family:   . Frequency of Social Gatherings with Friends and Family:   . Attends Religious Services:   . Active Member of Clubs or Organizations:   . Attends Archivist Meetings:   [name] Kitchen Marital Status:   Intimate Partner Violence:   . Fear of Current or Ex-Partner:   .  Emotionally Abused:   [name] Kitchen Physically Abused:   . Sexually Abused:     Outpatient Medications Prior to Visit  Medication Sig Dispense Refill  . aspirin 81 MG tablet Take 81 mg by mouth daily.    [name] Kitchen atorvastatin (LIPITOR) 20 MG tablet TAKE 1 TABLET BY MOUTH EVERYDAY AT BEDTIME 90 tablet 0  . cetirizine (ZYRTEC) 5 MG tablet Take 1 tablet by mouth daily.    . Cholecalciferol (VITAMIN D3) 5000 units CAPS Take 1 capsule by mouth daily.    . hydrochlorothiazide (MICROZIDE) 12.5 MG capsule Take 1 capsule (12.5 mg total) by mouth daily. 60 capsule 0  . lisinopril (ZESTRIL) 20 MG tablet TAKE 1 TABLET BY MOUTH EVERY DAY 90 tablet 0  . mometasone (NASONEX) 50 MCG/ACT nasal  spray PLACE 2 SPRAYS INTO THE NOSE DAILY. 17 g 5  . albuterol (VENTOLIN HFA) 108 (90 Base) MCG/ACT inhaler Inhale 1-2 puffs into the lungs every 4 (four) hours as needed for wheezing or shortness of breath. 18 g 5  . Olopatadine HCl (PAZEO) 0.7 % SOLN Place 1 drop into both eyes daily as needed. (Patient not taking: Reported on 04/11/2019) 2.5 mL 5   No facility-administered medications prior to visit.    Allergies  Allergen Reactions  . Hydrocodone Other (See Comments)    Skin turns red    ROS Review of Systems  A fourteen system review of systems was performed and found to be positive as per HPI.  Objective:    Physical Exam General: Well nourished, in no apparent distress. Eyes: PERRLA, EOMs, conjunctiva clr Resp: Respiratory effort- normal, ECTA B/L w/o W/R/R  Cardio: RRR w/o MRGs. Abdomen: no gross distention. Lymphatics:  less 2 sec cap RF M-sk: Full ROM, good strength, normal gait.  Skin: Warm, dry  Neuro: Alert, Oriented Psych: Normal affect, Insight and Judgment appropriate.   BP 127/85   Pulse 78   Temp 98.5 F (36.9 C) (Oral)   Ht 6\' 8"  (2.032 m)   Wt (!) 368 lb (166.9 kg)   SpO2 99% Comment: on RA  BMI 40.43 kg/m  Wt Readings from Last 3 Encounters:  11/03/19 (!) 368 lb (166.9 kg)  06/20/19 (!) 366 lb 6.4 oz (166.2 kg)  04/11/19 (!) 358 lb 12.8 oz (162.8 kg)     Health Maintenance Due  Topic Date Due  . Hepatitis C Screening  Never done  . INFLUENZA VACCINE  10/30/2019    There are no preventive care reminders to display for this patient.  Lab Results  Component Value Date   TSH 1.270 04/04/2019   Lab Results  Component Value Date   WBC 7.2 04/04/2019   HGB 14.2 04/04/2019   HCT 41.7 04/04/2019   MCV 94 04/04/2019   PLT 193 04/04/2019   Lab Results  Component Value Date   NA 142 04/04/2019   K 4.4 04/04/2019   CO2 28 04/04/2019   GLUCOSE 111 (H) 04/04/2019   BUN 17 04/04/2019   CREATININE 0.97 04/04/2019   BILITOT 0.6 04/04/2019    ALKPHOS 88 04/04/2019   AST 24 04/04/2019   ALT 23 04/04/2019   PROT 6.8 04/04/2019   ALBUMIN 4.3 04/04/2019   CALCIUM 9.7 04/04/2019   Lab Results  Component Value Date   CHOL 136 04/04/2019   Lab Results  Component Value Date   HDL 37 (L) 04/04/2019   Lab Results  Component Value Date   LDLCALC 80 04/04/2019   Lab Results  Component Value Date  TRIG 99 04/04/2019   Lab Results  Component Value Date   CHOLHDL 3.7 04/04/2019   Lab Results  Component Value Date   HGBA1C 5.6 04/04/2019      Assessment & Plan:   Problem List Items Addressed This Visit      Cardiovascular and Mediastinum   Essential hypertension - Primary (Chronic)    - BP at goal - Continue current medication regimen. - Encourage ambulatory BP and pulse monitoring, and advised to purchase xlarge size cuff from store/amazon. - Continue DASH diet and good hydration. - Encourage to stay as active as possible.         Other   Mixed hyperlipidemia (Chronic)    -Last lipid panel wnls with exception of low HDL but improved from prior. -Continue Atorvastatin 20 mg -Follow heart healthy diet. -Plan to recheck lipid panel and hepatic function next OV.         No orders of the defined types were placed in this encounter.   Follow-up: Return in about 4 months (around 03/04/2020) for HTN, HLD, and FBW (lipid panel, cmp).    Lorrene Reid, PA-C

## 2019-11-03 NOTE — Patient Instructions (Signed)
DASH Eating Plan DASH stands for "Dietary Approaches to Stop Hypertension." The DASH eating plan is a healthy eating plan that has been shown to reduce high blood pressure (hypertension). It may also reduce your risk for type 2 diabetes, heart disease, and stroke. The DASH eating plan may also help with weight loss. What are tips for following this plan?  General guidelines  Avoid eating more than 2,300 mg (milligrams) of salt (sodium) a day. If you have hypertension, you may need to reduce your sodium intake to 1,500 mg a day.  Limit alcohol intake to no more than 1 drink a day for nonpregnant women and 2 drinks a day for men. One drink equals 12 oz of beer, 5 oz of wine, or 1 oz of hard liquor.  Work with your health care provider to maintain a healthy body weight or to lose weight. Ask what an ideal weight is for you.  Get at least 30 minutes of exercise that causes your heart to beat faster (aerobic exercise) most days of the week. Activities may include walking, swimming, or biking.  Work with your health care provider or diet and nutrition specialist (dietitian) to adjust your eating plan to your individual calorie needs. Reading food labels   Check food labels for the amount of sodium per serving. Choose foods with less than 5 percent of the Daily Value of sodium. Generally, foods with less than 300 mg of sodium per serving fit into this eating plan.  To find whole grains, look for the word "whole" as the first word in the ingredient list. Shopping  Buy products labeled as "low-sodium" or "no salt added."  Buy fresh foods. Avoid canned foods and premade or frozen meals. Cooking  Avoid adding salt when cooking. Use salt-free seasonings or herbs instead of table salt or sea salt. Check with your health care provider or pharmacist before using salt substitutes.  Do not fry foods. Cook foods using healthy methods such as baking, boiling, grilling, and broiling instead.  Cook with  heart-healthy oils, such as olive, canola, soybean, or sunflower oil. Meal planning  Eat a balanced diet that includes: ? 5 or more servings of fruits and vegetables each day. At each meal, try to fill half of your plate with fruits and vegetables. ? Up to 6-8 servings of whole grains each day. ? Less than 6 oz of lean meat, poultry, or fish each day. A 3-oz serving of meat is about the same size as a deck of cards. One egg equals 1 oz. ? 2 servings of low-fat dairy each day. ? A serving of nuts, seeds, or beans 5 times each week. ? Heart-healthy fats. Healthy fats called Omega-3 fatty acids are found in foods such as flaxseeds and coldwater fish, like sardines, salmon, and mackerel.  Limit how much you eat of the following: ? Canned or prepackaged foods. ? Food that is high in trans fat, such as fried foods. ? Food that is high in saturated fat, such as fatty meat. ? Sweets, desserts, sugary drinks, and other foods with added sugar. ? Full-fat dairy products.  Do not salt foods before eating.  Try to eat at least 2 vegetarian meals each week.  Eat more home-cooked food and less restaurant, buffet, and fast food.  When eating at a restaurant, ask that your food be prepared with less salt or no salt, if possible. What foods are recommended? The items listed may not be a complete list. Talk with your dietitian about   what dietary choices are best for you. Grains Whole-grain or whole-wheat bread. Whole-grain or whole-wheat pasta. Brown rice. Oatmeal. Quinoa. Bulgur. Whole-grain and low-sodium cereals. Pita bread. Low-fat, low-sodium crackers. Whole-wheat flour tortillas. Vegetables Fresh or frozen vegetables (raw, steamed, roasted, or grilled). Low-sodium or reduced-sodium tomato and vegetable juice. Low-sodium or reduced-sodium tomato sauce and tomato paste. Low-sodium or reduced-sodium canned vegetables. Fruits All fresh, dried, or frozen fruit. Canned fruit in natural juice (without  added sugar). Meat and other protein foods Skinless chicken or turkey. Ground chicken or turkey. Pork with fat trimmed off. Fish and seafood. Egg whites. Dried beans, peas, or lentils. Unsalted nuts, nut butters, and seeds. Unsalted canned beans. Lean cuts of beef with fat trimmed off. Low-sodium, lean deli meat. Dairy Low-fat (1%) or fat-free (skim) milk. Fat-free, low-fat, or reduced-fat cheeses. Nonfat, low-sodium ricotta or cottage cheese. Low-fat or nonfat yogurt. Low-fat, low-sodium cheese. Fats and oils Soft margarine without trans fats. Vegetable oil. Low-fat, reduced-fat, or light mayonnaise and salad dressings (reduced-sodium). Canola, safflower, olive, soybean, and sunflower oils. Avocado. Seasoning and other foods Herbs. Spices. Seasoning mixes without salt. Unsalted popcorn and pretzels. Fat-free sweets. What foods are not recommended? The items listed may not be a complete list. Talk with your dietitian about what dietary choices are best for you. Grains Baked goods made with fat, such as croissants, muffins, or some breads. Dry pasta or rice meal packs. Vegetables Creamed or fried vegetables. Vegetables in a cheese sauce. Regular canned vegetables (not low-sodium or reduced-sodium). Regular canned tomato sauce and paste (not low-sodium or reduced-sodium). Regular tomato and vegetable juice (not low-sodium or reduced-sodium). Pickles. Olives. Fruits Canned fruit in a light or heavy syrup. Fried fruit. Fruit in cream or butter sauce. Meat and other protein foods Fatty cuts of meat. Ribs. Fried meat. Bacon. Sausage. Bologna and other processed lunch meats. Salami. Fatback. Hotdogs. Bratwurst. Salted nuts and seeds. Canned beans with added salt. Canned or smoked fish. Whole eggs or egg yolks. Chicken or turkey with skin. Dairy Whole or 2% milk, cream, and half-and-half. Whole or full-fat cream cheese. Whole-fat or sweetened yogurt. Full-fat cheese. Nondairy creamers. Whipped toppings.  Processed cheese and cheese spreads. Fats and oils Butter. Stick margarine. Lard. Shortening. Ghee. Bacon fat. Tropical oils, such as coconut, palm kernel, or palm oil. Seasoning and other foods Salted popcorn and pretzels. Onion salt, garlic salt, seasoned salt, table salt, and sea salt. Worcestershire sauce. Tartar sauce. Barbecue sauce. Teriyaki sauce. Soy sauce, including reduced-sodium. Steak sauce. Canned and packaged gravies. Fish sauce. Oyster sauce. Cocktail sauce. Horseradish that you find on the shelf. Ketchup. Mustard. Meat flavorings and tenderizers. Bouillon cubes. Hot sauce and Tabasco sauce. Premade or packaged marinades. Premade or packaged taco seasonings. Relishes. Regular salad dressings. Where to find more information:  National Heart, Lung, and Blood Institute: www.nhlbi.nih.gov  American Heart Association: www.heart.org Summary  The DASH eating plan is a healthy eating plan that has been shown to reduce high blood pressure (hypertension). It may also reduce your risk for type 2 diabetes, heart disease, and stroke.  With the DASH eating plan, you should limit salt (sodium) intake to 2,300 mg a day. If you have hypertension, you may need to reduce your sodium intake to 1,500 mg a day.  When on the DASH eating plan, aim to eat more fresh fruits and vegetables, whole grains, lean proteins, low-fat dairy, and heart-healthy fats.  Work with your health care provider or diet and nutrition specialist (dietitian) to adjust your eating plan to your   individual calorie needs. This information is not intended to replace advice given to you by your health care provider. Make sure you discuss any questions you have with your health care provider. Document Revised: 02/27/2017 Document Reviewed: 03/10/2016 Elsevier Patient Education  2020 Elsevier Inc.  

## 2019-11-03 NOTE — Assessment & Plan Note (Addendum)
-  Last lipid panel wnls with exception of low HDL but improved from prior. -Continue Atorvastatin 20 mg -Follow heart healthy diet. -Plan to recheck lipid panel and hepatic function next OV.

## 2019-12-22 ENCOUNTER — Other Ambulatory Visit: Payer: Self-pay | Admitting: Physician Assistant

## 2019-12-22 DIAGNOSIS — I10 Essential (primary) hypertension: Secondary | ICD-10-CM

## 2019-12-22 MED ORDER — HYDROCHLOROTHIAZIDE 12.5 MG PO CAPS: 12.5000 mg | ORAL_CAPSULE | Freq: Every day | ORAL | 0 refills | Status: DC

## 2020-01-16 ENCOUNTER — Telehealth: Payer: Self-pay | Admitting: Physician Assistant

## 2020-01-16 DIAGNOSIS — I10 Essential (primary) hypertension: Secondary | ICD-10-CM

## 2020-01-16 MED ORDER — LISINOPRIL 20 MG PO TABS: 20.0000 mg | ORAL_TABLET | Freq: Every day | ORAL | 0 refills | Status: DC

## 2020-01-16 NOTE — Telephone Encounter (Signed)
Refill sent to requested pharmacy. AS, CMA 

## 2020-01-16 NOTE — Telephone Encounter (Signed)
Patient is about out of his lisinopril and has been waiting for a refill for over a week, the order was flowing to previous PCP. Can we send a refill to [facility] in [place] please?

## 2020-01-16 NOTE — Addendum Note (Signed)
Addended by: Mickel Crow on: 01/16/2020 10:15 AM   Modules accepted: Orders

## 2020-01-27 ENCOUNTER — Other Ambulatory Visit: Payer: Self-pay | Admitting: Physician Assistant

## 2020-01-27 DIAGNOSIS — E782 Mixed hyperlipidemia: Secondary | ICD-10-CM

## 2020-03-01 ENCOUNTER — Ambulatory Visit: Payer: BC Managed Care – PPO | Admitting: Physician Assistant

## 2020-03-05 ENCOUNTER — Encounter: Payer: Self-pay | Admitting: Physician Assistant

## 2020-03-05 ENCOUNTER — Ambulatory Visit (INDEPENDENT_AMBULATORY_CARE_PROVIDER_SITE_OTHER): Payer: BC Managed Care – PPO | Admitting: Physician Assistant

## 2020-03-05 ENCOUNTER — Other Ambulatory Visit: Payer: Self-pay

## 2020-03-05 VITALS — BP 130/71 | HR 71 | Temp 98.4°F | Ht >= 80 in | Wt 366.8 lb

## 2020-03-05 DIAGNOSIS — Z0000 Encounter for general adult medical examination without abnormal findings: Secondary | ICD-10-CM | POA: Diagnosis not present

## 2020-03-05 DIAGNOSIS — I10 Essential (primary) hypertension: Secondary | ICD-10-CM | POA: Diagnosis not present

## 2020-03-05 DIAGNOSIS — E782 Mixed hyperlipidemia: Secondary | ICD-10-CM | POA: Diagnosis not present

## 2020-03-05 DIAGNOSIS — Z131 Encounter for screening for diabetes mellitus: Secondary | ICD-10-CM

## 2020-03-05 NOTE — Assessment & Plan Note (Signed)
-  Controlled. -Continue current medication regimen. -Continue to stay well hydrated and monitor sodium -Will continue to monitor. -Collecting CMP today for medication monitoring.

## 2020-03-05 NOTE — Assessment & Plan Note (Signed)
-  Last lipid panel: total cholesterol 136, triglycerides 99, HDL 37, LDL 80 -Continue Lipitor. -Follow a diet low in saturated and trans fats. Continue to stay as active as possible. -Repeating lipid panel and hepatic function today.

## 2020-03-05 NOTE — Patient Instructions (Signed)

## 2020-03-05 NOTE — Progress Notes (Signed)
Established Patient Office Visit  Subjective:  Patient ID: David Gonzalez, male    DOB: 18-Jul-1970  Age: 49 y.o. MRN: [id number]  CC:  Chief Complaint  Patient presents with  . Hypertension    HPI David Gonzalez presents for follow up on hypertension and hyperlipidemia. Patient has no acute concerns today.  HTN: Pt denies chest pain, palpitations, dizziness or increased lower extremity edema from baseline. Taking medication as directed without side effects. Pt reports good hydration.  HLD: Pt taking medication as directed without issues. Denies side effects. 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 Has increased salad use.   Past Medical History:  Diagnosis Date  . Complication of anesthesia    reported to be violent waking from anesthesia at age 80  . Headache(784.0)   . Mental disorder    mildly claustrophobic    Past Surgical History:  Procedure Laterality Date  . ANTERIOR CERVICAL DECOMP/DISCECTOMY FUSION  04/30/2011   Procedure: ANTERIOR CERVICAL DECOMPRESSION/DISCECTOMY FUSION 1 LEVEL;  Surgeon: Dahlia Bailiff, MD;  Location: [facility];  Service: Orthopedics;  Laterality: N/A;  ACDF C5-6  . KNEE ARTHROSCOPY    . TONSILLECTOMY      Family History  Problem Relation Age of Onset  . Diabetes Mother   . Hypertension Mother   . Arthritis Mother   . Neuropathy Mother   . Hypertension Father   . Cancer Father   . Hypertension Sister   . Hypertension Maternal Grandmother   . Schizophrenia Maternal Grandfather   . Hypertension Paternal Grandmother   . Hypertension Paternal Grandfather     Social History   Socioeconomic History  . Marital status: Married    Spouse name: Not on file  . Number of children: Not on file  . Years of education: Not on file  . Highest education level: Not on file  Occupational History  . Not on file  Tobacco Use  . Smoking status: Never Smoker  . Smokeless tobacco: Never Used   Vaping Use  . Vaping Use: Never used  Substance and Sexual Activity  . Alcohol use: No  . Drug use: No  . Sexual activity: Yes    Birth control/protection: Pill  Other Topics Concern  . Not on file  Social History Narrative  . Not on file   Social Determinants of Health   Financial Resource Strain:   . Difficulty of Paying Living Expenses: Not on file  Food Insecurity:   . Worried About Charity fundraiser in the Last Year: Not on file  . Ran Out of Food in the Last Year: Not on file  Transportation Needs:   . Lack of Transportation (Medical): Not on file  . Lack of Transportation (Non-Medical): Not on file  Physical Activity:   . Days of Exercise per Week: Not on file  . Minutes of Exercise per Session: Not on file  Stress:   . Feeling of Stress : Not on file  Social Connections:   . Frequency of Communication with Friends and Family: Not on file  . Frequency of Social Gatherings with Friends and Family: Not on file  . Attends Religious Services: Not on file  . Active Member of Clubs or Organizations: Not on file  . Attends Archivist Meetings: Not on file  . Marital Status: Not on file  Intimate Partner Violence:   . Fear of Current or Ex-Partner: Not on file  .  Emotionally Abused: Not on file  . Physically Abused: Not on file  . Sexually Abused: Not on file    Outpatient Medications Prior to Visit  Medication Sig Dispense Refill  . aspirin 81 MG tablet Take 81 mg by mouth daily.    [name] Kitchen atorvastatin (LIPITOR) 20 MG tablet TAKE 1 TABLET BY MOUTH EVERYDAY AT BEDTIME 90 tablet 0  . cetirizine (ZYRTEC) 5 MG tablet Take 1 tablet by mouth daily.    . Cholecalciferol (VITAMIN D3) 5000 units CAPS Take 1 capsule by mouth daily.    . hydrochlorothiazide (MICROZIDE) 12.5 MG capsule Take 1 capsule (12.5 mg total) by mouth daily. 90 capsule 0  . lisinopril (ZESTRIL) 20 MG tablet Take 1 tablet (20 mg total) by mouth daily. 90 tablet 0  . mometasone (NASONEX) 50 MCG/ACT  nasal spray PLACE 2 SPRAYS INTO THE NOSE DAILY. 17 g 5   No facility-administered medications prior to visit.    Allergies  Allergen Reactions  . Hydrocodone Other (See Comments)    Skin turns red    ROS Review of Systems A fourteen system review of systems was performed and found to be positive as per HPI.    Objective:    Physical Exam General:  Well Developed, well nourished, in no acute distress  Neuro:  Alert and oriented,  extra-ocular muscles intact  HEENT:  Normocephalic, atraumatic, neck supple Skin:  no gross rash, warm, pink. Cardiac:  RRR, S1 S2, w/o murmur Respiratory:  ECTA B/L, Not using accessory muscles, speaking in full sentences- unlabored. Vascular:  Ext warm, no cyanosis apprec.; cap RF less 2 sec. Psych:  No HI/SI, judgement and insight good, Euthymic mood. Full Affect.  BP 130/71   Pulse 71   Temp 98.4 F (36.9 C) (Oral)   Ht 6' 8" (2.032 m)   Wt (!) 366 lb 12.8 oz (166.4 kg)   SpO2 99% Comment: on RA  BMI 40.30 kg/m  Wt Readings from Last 3 Encounters:  03/05/20 (!) 366 lb 12.8 oz (166.4 kg)  11/03/19 (!) 368 lb (166.9 kg)  06/20/19 (!) 366 lb 6.4 oz (166.2 kg)     Health Maintenance Due  Topic Date Due  . Hepatitis C Screening  Never done    There are no preventive care reminders to display for this patient.  Lab Results  Component Value Date   TSH 1.270 04/04/2019   Lab Results  Component Value Date   WBC 7.2 04/04/2019   HGB 14.2 04/04/2019   HCT 41.7 04/04/2019   MCV 94 04/04/2019   PLT 193 04/04/2019   Lab Results  Component Value Date   NA 142 04/04/2019   K 4.4 04/04/2019   CO2 28 04/04/2019   GLUCOSE 111 (H) 04/04/2019   BUN 17 04/04/2019   CREATININE 0.97 04/04/2019   BILITOT 0.6 04/04/2019   ALKPHOS 88 04/04/2019   AST 24 04/04/2019   ALT 23 04/04/2019   PROT 6.8 04/04/2019   ALBUMIN 4.3 04/04/2019   CALCIUM 9.7 04/04/2019   Lab Results  Component Value Date   CHOL 136 04/04/2019   Lab Results   Component Value Date   HDL 37 (L) 04/04/2019   Lab Results  Component Value Date   LDLCALC 80 04/04/2019   Lab Results  Component Value Date   TRIG 99 04/04/2019   Lab Results  Component Value Date   CHOLHDL 3.7 04/04/2019   Lab Results  Component Value Date   HGBA1C 5.6 04/04/2019  Assessment & Plan:   Problem List Items Addressed This Visit      Cardiovascular and Mediastinum   Essential hypertension - Primary (Chronic)    -Controlled. -Continue current medication regimen. -Continue to stay well hydrated and monitor sodium -Will continue to monitor. -Collecting CMP today for medication monitoring.       Relevant Orders   Comp Met (CMET)   CBC w/Diff   Lipid Profile   HgB A1c     Other   Mixed hyperlipidemia (Chronic)    -Last lipid panel: total cholesterol 136, triglycerides 99, HDL 37, LDL 80 -Continue Lipitor. -Follow a diet low in saturated and trans fats. Continue to stay as active as possible. -Repeating lipid panel and hepatic function today.      Relevant Orders   Comp Met (CMET)   CBC w/Diff   Lipid Profile   HgB A1c    Other Visit Diagnoses    Healthcare maintenance       Relevant Orders   Comp Met (CMET)   CBC w/Diff   Lipid Profile   HgB A1c   TSH   Screening for diabetes mellitus       Relevant Orders   HgB A1c     Healthcare Maintenance: -Patient is fasting so will collect FBW for CPE. -Patient declined influenza vaccine. -Follow up in 3-4 months for CPE.   No orders of the defined types were placed in this encounter.   Follow-up: Return for CPE in 3-4 months.    Lorrene Reid, PA-C

## 2020-03-06 LAB — COMPREHENSIVE METABOLIC PANEL
ALT: 18 IU/L (ref 0–44)
AST: 19 IU/L (ref 0–40)
Albumin/Globulin Ratio: 1.5 (ref 1.2–2.2)
Albumin: 4.4 g/dL (ref 4.0–5.0)
Alkaline Phosphatase: 86 IU/L (ref 44–121)
BUN/Creatinine Ratio: 17 (ref 9–20)
BUN: 17 mg/dL (ref 6–24)
Bilirubin Total: 0.7 mg/dL (ref 0.0–1.2)
CO2: 24 mmol/L (ref 20–29)
Calcium: 9.7 mg/dL (ref 8.7–10.2)
Chloride: 102 mmol/L (ref 96–106)
Creatinine, Ser: 1 mg/dL (ref 0.76–1.27)
GFR calc Af Amer: 102 mL/min/{1.73_m2} (ref >59)
GFR calc non Af Amer: 88 mL/min/{1.73_m2} (ref >59)
Globulin, Total: 2.9 g/dL (ref 1.5–4.5)
Glucose: 101 mg/dL — ABNORMAL HIGH (ref 65–99)
Potassium: 4.4 mmol/L (ref 3.5–5.2)
Sodium: 140 mmol/L (ref 134–144)
Total Protein: 7.3 g/dL (ref 6.0–8.5)

## 2020-03-06 LAB — CBC WITH DIFFERENTIAL/PLATELET
Basophils Absolute: 0.1 10*3/uL (ref 0.0–0.2)
Basos: 1 %
EOS (ABSOLUTE): 0.1 10*3/uL (ref 0.0–0.4)
Eos: 2 %
Hematocrit: 42.3 % (ref 37.5–51.0)
Hemoglobin: 14.3 g/dL (ref 13.0–17.7)
Immature Grans (Abs): 0 10*3/uL (ref 0.0–0.1)
Immature Granulocytes: 1 %
Lymphocytes Absolute: 1.5 10*3/uL (ref 0.7–3.1)
Lymphs: 23 %
MCH: 31.9 pg (ref 26.6–33.0)
MCHC: 33.8 g/dL (ref 31.5–35.7)
MCV: 94 fL (ref 79–97)
Monocytes Absolute: 0.5 10*3/uL (ref 0.1–0.9)
Monocytes: 8 %
Neutrophils Absolute: 4.4 10*3/uL (ref 1.4–7.0)
Neutrophils: 65 %
Platelets: 190 10*3/uL (ref 150–450)
RBC: 4.48 x10E6/uL (ref 4.14–5.80)
RDW: 11.9 % (ref 11.6–15.4)
WBC: 6.6 10*3/uL (ref 3.4–10.8)

## 2020-03-06 LAB — LIPID PANEL
Chol/HDL Ratio: 3.7 ratio (ref 0.0–5.0)
Cholesterol, Total: 130 mg/dL (ref 100–199)
HDL: 35 mg/dL — ABNORMAL LOW (ref >39)
LDL Chol Calc (NIH): 79 mg/dL (ref 0–99)
Triglycerides: 81 mg/dL (ref 0–149)
VLDL Cholesterol Cal: 16 mg/dL (ref 5–40)

## 2020-03-06 LAB — HEMOGLOBIN A1C
Est. average glucose Bld gHb Est-mCnc: 123 mg/dL
Hgb A1c MFr Bld: 5.9 % — ABNORMAL HIGH (ref 4.8–5.6)

## 2020-03-06 LAB — TSH: TSH: 1.45 u[IU]/mL (ref 0.450–4.500)

## 2020-03-19 ENCOUNTER — Other Ambulatory Visit: Payer: Self-pay | Admitting: Physician Assistant

## 2020-03-19 DIAGNOSIS — I10 Essential (primary) hypertension: Secondary | ICD-10-CM

## 2020-04-09 ENCOUNTER — Other Ambulatory Visit: Payer: Self-pay | Admitting: Physician Assistant

## 2020-04-09 DIAGNOSIS — I10 Essential (primary) hypertension: Secondary | ICD-10-CM

## 2020-04-25 ENCOUNTER — Other Ambulatory Visit: Payer: Self-pay | Admitting: Physician Assistant

## 2020-04-25 DIAGNOSIS — E782 Mixed hyperlipidemia: Secondary | ICD-10-CM

## 2020-06-18 ENCOUNTER — Other Ambulatory Visit: Payer: Self-pay

## 2020-06-18 ENCOUNTER — Encounter: Payer: Self-pay | Admitting: Physician Assistant

## 2020-06-18 ENCOUNTER — Ambulatory Visit (INDEPENDENT_AMBULATORY_CARE_PROVIDER_SITE_OTHER): Payer: BC Managed Care – PPO | Admitting: Physician Assistant

## 2020-06-18 VITALS — BP 134/75 | HR 64 | Temp 99.3°F | Ht >= 80 in | Wt 358.6 lb

## 2020-06-18 DIAGNOSIS — Z1211 Encounter for screening for malignant neoplasm of colon: Secondary | ICD-10-CM

## 2020-06-18 DIAGNOSIS — E782 Mixed hyperlipidemia: Secondary | ICD-10-CM | POA: Diagnosis not present

## 2020-06-18 DIAGNOSIS — Z0000 Encounter for general adult medical examination without abnormal findings: Secondary | ICD-10-CM

## 2020-06-18 DIAGNOSIS — R7303 Prediabetes: Secondary | ICD-10-CM

## 2020-06-18 DIAGNOSIS — I10 Essential (primary) hypertension: Secondary | ICD-10-CM

## 2020-06-18 MED ORDER — HYDROCHLOROTHIAZIDE 12.5 MG PO CAPS: ORAL_CAPSULE | ORAL | 0 refills | Status: DC

## 2020-06-18 MED ORDER — LISINOPRIL 20 MG PO TABS: 20.0000 mg | ORAL_TABLET | Freq: Every day | ORAL | 0 refills | Status: DC

## 2020-06-18 MED ORDER — ATORVASTATIN CALCIUM 20 MG PO TABS: ORAL_TABLET | ORAL | 0 refills | Status: DC

## 2020-06-18 MED ORDER — MOMETASONE FUROATE 50 MCG/ACT NA SUSP: 2.0000 | Freq: Every day | NASAL | 0 refills | Status: DC

## 2020-06-18 NOTE — Patient Instructions (Signed)

## 2020-06-18 NOTE — Progress Notes (Signed)
Male physical   Impression and Recommendations:    1. Healthcare maintenance   2. Screening for colon cancer   3. Mixed hyperlipidemia   4. Essential hypertension      1) Anticipatory Guidance: Skin CA prevention- recommend to use sunscreen when outside along with skin surveillance; eating a balanced and modest diet; physical activity at least 25 minutes per day or minimum of 150 min/ week moderate to intense activity.  2) Immunizations / Screenings / Labs:   All immunizations are up-to-date per recommendations or will be updated today if pt allows.    - Patient understands with dental and vision screens they will schedule independently.  - UTD on Tdap. Agreeable to screening colonoscopy. Declined Hep C and HIV screenings at this time. - Will obtain CMP, A1c, Lipid panel.  3) Weight:  Recommend to continue to improve diet habits to improve overall feelings of well being and objective health data. Improve nutrient density of diet through increasing intake of fruits and vegetables and decreasing saturated fats, white flour products and refined sugars.   4) Healthcare Maintenance:  -Continue current medication regimen. -Continue with walking regimen. -Stay well hydrated. -Follow up in 4 months for HTN, HLD   Orders Placed This Encounter  Procedures   CBC   Comprehensive metabolic panel    Order Specific Question:   Has the patient fasted?    Answer:   Yes   Lipid panel    Order Specific Question:   Has the patient fasted?    Answer:   Yes   Ambulatory referral to Gastroenterology    Referral Priority:   Routine    Referral Type:   Consultation    Referral Reason:   Specialty Services Required    Number of Visits Requested:   1    Meds ordered this encounter  Medications   atorvastatin (LIPITOR) 20 MG tablet    Sig: TAKE 1 TABLET BY MOUTH EVERYDAY AT BEDTIME    Dispense:  90 tablet    Refill:  0   hydrochlorothiazide (MICROZIDE) 12.5 MG capsule    Sig:  TAKE 1 CAPSULE BY MOUTH EVERY DAY    Dispense:  90 capsule    Refill:  0   lisinopril (ZESTRIL) 20 MG tablet    Sig: Take 1 tablet (20 mg total) by mouth daily.    Dispense:  90 tablet    Refill:  0   mometasone (NASONEX) 50 MCG/ACT nasal spray    Sig: Place 2 sprays into the nose daily.    Dispense:  17 g    Refill:  0     Return in about 4 months (around 10/18/2020) for HTN, HLD.  Reminded pt important of f-up preventative CPE in 1 year.  Reminded pt again, this is in addition to any chronic care visits.    Gross side effects, risk and benefits, and alternatives of medications discussed with patient.  Patient is aware that all medications have potential side effects and we are unable to predict every side effect or drug-drug interaction that may occur.  Expresses verbal understanding and consents to current therapy plan and treatment regimen.  Please see AVS handed out to patient at the end of our visit for further patient instructions/ counseling done pertaining to today's office visit.       Subjective:        CC: CPE   HPI: David Gonzalez is a 50 y.o. male who presents to [place]  at [facility] today for a yearly health maintenance exam.     Health Maintenance Summary  - Reviewed and updated, unless pt declines services.  Last Cologuard or Colonoscopy: Placed order. Family history of Colon CA: N Tobacco History Reviewed:  Y, never a smoker Abdominal Ultrasound:  N/A CT scan for screening lung CA:  N/A Alcohol / drug use:    No concerns, no excessive use / no use Dental Home:  Y Male history: STD concerns:   none Additional penile/ urinary concerns: none   Additional concerns beyond Health Maintenance issues:   none    Immunization History  Administered Date(s) Administered   PFIZER(Purple Top)SARS-COV-2 Vaccination 06/11/2019, 07/11/2019   Tdap 10/08/2015     Health Maintenance  Topic Date Due   COLONOSCOPY (Pts 45-36yrs  Insurance coverage will need to be confirmed)  Never done   COVID-19 Vaccine (3 - Booster for [place] series) 01/10/2020   INFLUENZA VACCINE  06/28/2020 (Originally 10/30/2019)   Hepatitis C Screening  06/18/2021 (Originally 16-Oct-1970)   HIV Screening  06/18/2021 (Originally 09/07/1985)   TETANUS/TDAP  10/07/2025   HPV VACCINES  Aged Out       Wt Readings from Last 3 Encounters:  06/18/20 (!) 358 lb 9.6 oz (162.7 kg)  03/05/20 (!) 366 lb 12.8 oz (166.4 kg)  11/03/19 (!) 368 lb (166.9 kg)   BP Readings from Last 3 Encounters:  06/18/20 134/75  03/05/20 130/71  11/03/19 127/85   Pulse Readings from Last 3 Encounters:  06/18/20 64  03/05/20 71  11/03/19 78    Patient Active Problem List   Diagnosis Date Noted   Seasonal and perennial allergic rhinoconjunctivitis 06/20/2019   Tinnitus of right ear 02/22/2018   Hearing disorder of both ears 09/04/2017   Chronic sinusitis 09/04/2017   Dysfunction of eustachian tube 09/04/2017   No-show for appointment 04/12/2016   Prediabetes 10/15/2015   Low HDL (under 40) 10/15/2015   Vitamin D insufficiency 10/15/2015   mild occ Fatigue 10/08/2015   Environmental and seasonal allergies 09/11/2015   Mixed hyperlipidemia 09/11/2015   Morbid obesity ([place]) 09/11/2015   Callus of foot 09/11/2015   Essential hypertension 03/08/2014    Past Medical History:  Diagnosis Date   Complication of anesthesia    reported to be violent waking from anesthesia at age 50   Headache(784.0)    Hypertension    Phreesia 06/17/2020   Mental disorder    mildly claustrophobic    Past Surgical History:  Procedure Laterality Date   ANTERIOR CERVICAL DECOMP/DISCECTOMY FUSION  04/30/2011   Procedure: ANTERIOR CERVICAL DECOMPRESSION/DISCECTOMY FUSION 1 LEVEL;  Surgeon: Dahlia Bailiff, MD;  Location: [place];  Service: Orthopedics;  Laterality: N/A;  ACDF C5-6   KNEE ARTHROSCOPY     SPINE SURGERY N/A    Phreesia 06/17/2020    TONSILLECTOMY      Family History  Problem Relation Age of Onset   Diabetes Mother    Hypertension Mother    Arthritis Mother    Neuropathy Mother    Hypertension Father    Cancer Father    Hypertension Sister    Hypertension Maternal Grandmother    Schizophrenia Maternal Grandfather    Hypertension Paternal Grandmother    Hypertension Paternal Grandfather     Social History   Substance and Sexual Activity  Drug Use No  ,  Social History   Substance and Sexual Activity  Alcohol Use No  ,  Social History   Tobacco Use  Smoking Status Never  Smoker  Smokeless Tobacco Never Used  ,  Social History   Substance and Sexual Activity  Sexual Activity Yes   Birth control/protection: Pill    Patient's Medications  New Prescriptions   No medications on file  Previous Medications   ASPIRIN 81 MG TABLET    Take 81 mg by mouth daily.   CETIRIZINE (ZYRTEC) 5 MG TABLET    Take 1 tablet by mouth daily.   CHOLECALCIFEROL (VITAMIN D3) 5000 UNITS CAPS    Take 1 capsule by mouth daily.   DOXYCYCLINE (VIBRA-TABS) 100 MG TABLET    Take 100 mg by mouth 2 (two) times daily.  Modified Medications   Modified Medication Previous Medication   ATORVASTATIN (LIPITOR) 20 MG TABLET atorvastatin (LIPITOR) 20 MG tablet      TAKE 1 TABLET BY MOUTH EVERYDAY AT BEDTIME    TAKE 1 TABLET BY MOUTH EVERYDAY AT BEDTIME   HYDROCHLOROTHIAZIDE (MICROZIDE) 12.5 MG CAPSULE hydrochlorothiazide (MICROZIDE) 12.5 MG capsule      TAKE 1 CAPSULE BY MOUTH EVERY DAY    TAKE 1 CAPSULE BY MOUTH EVERY DAY   LISINOPRIL (ZESTRIL) 20 MG TABLET lisinopril (ZESTRIL) 20 MG tablet      Take 1 tablet (20 mg total) by mouth daily.    TAKE 1 TABLET BY MOUTH EVERY DAY   MOMETASONE (NASONEX) 50 MCG/ACT NASAL SPRAY mometasone (NASONEX) 50 MCG/ACT nasal spray      Place 2 sprays into the nose daily.    PLACE 2 SPRAYS INTO THE NOSE DAILY.  Discontinued Medications   No medications on file    Other and  Hydrocodone  Review of Systems: General:   Denies fever, chills, unexplained weight loss.  Optho/Auditory:   Denies visual changes, blurred vision/LOV Respiratory:   Denies SOB, DOE more than baseline levels.   Cardiovascular:   Denies chest pain, palpitations, new onset peripheral edema  Gastrointestinal:   Denies nausea, vomiting, diarrhea.  Genitourinary: Denies dysuria, freq/ urgency, flank pain  Endocrine:     Denies hot or cold intolerance, polyuria, polydipsia. Musculoskeletal:   Denies unexplained myalgias, joint swelling, unexplained arthralgias, gait problems.  Skin:  Denies rash, suspicious lesions Neurological:     Denies dizziness, unexplained weakness, numbness  Psychiatric/Behavioral:   Denies mood changes, suicidal or homicidal ideations, hallucinations    Objective:     Blood pressure 134/75, pulse 64, temperature 99.3 F (37.4 C), temperature source Temporal, height 6\' 8"  (2.032 m), weight (!) 358 lb 9.6 oz (162.7 kg), SpO2 100 %. Body mass index is 39.39 kg/m. General Appearance:    Alert, cooperative, no distress, appears stated age  Head:    Normocephalic, without obvious abnormality, atraumatic  Eyes:    PERRL, conjunctiva/corneas clear, EOM's intact, both eyes  Ears:    Normal TM's and external ear canals, both ears  Nose:   Nares normal, septum midline, mucosa normal, no drainage    or sinus tenderness  Throat:   Lips w/o lesion, mucosa moist, and tongue normal; teeth and gums normal  Neck:   Supple, symmetrical, trachea midline, no adenopathy;    thyroid:  no enlargement/tenderness/nodules; no carotid   bruit or JVD  Back:     Mild scoliosis, ROM normal, no CVA tenderness  Lungs:     Clear to auscultation bilaterally, respirations unlabored, no  Wh/ R/ R  Chest Wall:    No tenderness or gross deformity; normal excursion   Heart:    Regular rate and rhythm, S1 and S2 normal, no murmur, rub  or gallop  Abdomen:     Soft, non-tender, bowel sounds active  all four quadrants, No G/R/R, no masses, no organomegaly  Genitalia:   Deferred. No concerns/sxs.  Rectal:   Deferred. No concerns/sxs.  Extremities:   Extremities normal, atraumatic, no cyanosis or gross edema  Pulses:   2+ and symmetric all extremities  Skin:   Warm, dry, Skin color, texture, turgor normal, no obvious rashes or lesions  M-Sk:   Ambulates * 4 w/o difficulty, no gross deformities, tone WNL  Neurologic:   CNII-XII grossly intact Psych:  No HI/SI, judgement and insight good, Euthymic mood. Full Affect.

## 2020-06-18 NOTE — Addendum Note (Signed)
Addended by: Mickel Crow on: 06/18/2020 09:20 AM   Modules accepted: Orders

## 2020-06-19 LAB — COMPREHENSIVE METABOLIC PANEL
ALT: 17 IU/L (ref 0–44)
AST: 17 IU/L (ref 0–40)
Albumin/Globulin Ratio: 1.6 (ref 1.2–2.2)
Albumin: 4.5 g/dL (ref 4.0–5.0)
Alkaline Phosphatase: 85 IU/L (ref 44–121)
BUN/Creatinine Ratio: 21 — ABNORMAL HIGH (ref 9–20)
BUN: 19 mg/dL (ref 6–24)
Bilirubin Total: 0.4 mg/dL (ref 0.0–1.2)
CO2: 23 mmol/L (ref 20–29)
Calcium: 9.3 mg/dL (ref 8.7–10.2)
Chloride: 103 mmol/L (ref 96–106)
Creatinine, Ser: 0.91 mg/dL (ref 0.76–1.27)
Globulin, Total: 2.8 g/dL (ref 1.5–4.5)
Glucose: 105 mg/dL — ABNORMAL HIGH (ref 65–99)
Potassium: 4.4 mmol/L (ref 3.5–5.2)
Sodium: 140 mmol/L (ref 134–144)
Total Protein: 7.3 g/dL (ref 6.0–8.5)
eGFR: 103 mL/min/{1.73_m2} (ref >59)

## 2020-06-19 LAB — LIPID PANEL
Chol/HDL Ratio: 3.2 ratio (ref 0.0–5.0)
Cholesterol, Total: 122 mg/dL (ref 100–199)
HDL: 38 mg/dL — ABNORMAL LOW (ref >39)
LDL Chol Calc (NIH): 71 mg/dL (ref 0–99)
Triglycerides: 58 mg/dL (ref 0–149)
VLDL Cholesterol Cal: 13 mg/dL (ref 5–40)

## 2020-07-12 ENCOUNTER — Other Ambulatory Visit: Payer: Self-pay | Admitting: Physician Assistant

## 2020-07-26 ENCOUNTER — Telehealth: Payer: Self-pay | Admitting: Physician Assistant

## 2020-07-26 DIAGNOSIS — Z1211 Encounter for screening for malignant neoplasm of colon: Secondary | ICD-10-CM

## 2020-07-26 NOTE — Telephone Encounter (Signed)
Referral placed per protocol. AS, CMA 

## 2020-07-31 ENCOUNTER — Other Ambulatory Visit: Payer: Self-pay | Admitting: Physician Assistant

## 2020-08-02 ENCOUNTER — Telehealth: Payer: Self-pay | Admitting: Physician Assistant

## 2020-08-02 DIAGNOSIS — J329 Chronic sinusitis, unspecified: Secondary | ICD-10-CM

## 2020-08-02 MED ORDER — MOMETASONE FUROATE 50 MCG/ACT NA SUSP: 2.0000 | Freq: Every day | NASAL | 2 refills | Status: AC

## 2020-08-02 NOTE — Telephone Encounter (Signed)
17G with 2 refills sent to pharmacy. AS, CMA

## 2020-08-02 NOTE — Telephone Encounter (Signed)
Patient states the pharmacist says his medication mometasone needs to be a 51 instead of a 17, and patient needs a 3 month supply and it to be classified as a maintenance medicine that he takes all the time. Patient uses [facility] in [place], [place]. Thanks.

## 2020-08-02 NOTE — Addendum Note (Signed)
Addended by: Mickel Crow on: 08/02/2020 03:34 PM   Modules accepted: Orders

## 2020-09-14 ENCOUNTER — Other Ambulatory Visit: Payer: Self-pay | Admitting: Physician Assistant

## 2020-09-14 DIAGNOSIS — I10 Essential (primary) hypertension: Secondary | ICD-10-CM

## 2020-10-08 ENCOUNTER — Other Ambulatory Visit: Payer: Self-pay | Admitting: Physician Assistant

## 2020-10-08 DIAGNOSIS — E782 Mixed hyperlipidemia: Secondary | ICD-10-CM

## 2020-10-22 ENCOUNTER — Ambulatory Visit: Payer: BC Managed Care – PPO | Admitting: Physician Assistant

## 2020-12-14 ENCOUNTER — Other Ambulatory Visit: Payer: Self-pay | Admitting: Physician Assistant

## 2020-12-14 DIAGNOSIS — I10 Essential (primary) hypertension: Secondary | ICD-10-CM

## 2020-12-21 ENCOUNTER — Encounter: Payer: Self-pay | Admitting: Gastroenterology

## 2020-12-25 ENCOUNTER — Encounter: Payer: Self-pay | Admitting: Gastroenterology

## 2021-01-10 ENCOUNTER — Other Ambulatory Visit: Payer: Self-pay | Admitting: Physician Assistant

## 2021-01-10 DIAGNOSIS — I10 Essential (primary) hypertension: Secondary | ICD-10-CM

## 2021-01-25 ENCOUNTER — Other Ambulatory Visit: Payer: Self-pay | Admitting: Physician Assistant

## 2021-01-25 DIAGNOSIS — I10 Essential (primary) hypertension: Secondary | ICD-10-CM

## 2021-02-09 ENCOUNTER — Other Ambulatory Visit: Payer: Self-pay | Admitting: Physician Assistant

## 2021-02-09 DIAGNOSIS — I10 Essential (primary) hypertension: Secondary | ICD-10-CM

## 2021-03-02 ENCOUNTER — Other Ambulatory Visit: Payer: Self-pay | Admitting: Physician Assistant

## 2021-03-02 DIAGNOSIS — I10 Essential (primary) hypertension: Secondary | ICD-10-CM

## 2021-03-04 ENCOUNTER — Encounter: Payer: BC Managed Care – PPO | Admitting: Gastroenterology

## 2021-03-11 ENCOUNTER — Encounter: Payer: BC Managed Care – PPO | Admitting: Gastroenterology

## 2021-04-03 ENCOUNTER — Ambulatory Visit: Payer: Self-pay | Admitting: *Deleted

## 2021-04-03 ENCOUNTER — Other Ambulatory Visit: Payer: Self-pay

## 2021-04-03 VITALS — Ht >= 80 in | Wt 369.0 lb

## 2021-04-03 DIAGNOSIS — Z1211 Encounter for screening for malignant neoplasm of colon: Secondary | ICD-10-CM

## 2021-04-03 MED ORDER — SUTAB 1479-225-188 MG PO TABS
1.0000 | ORAL_TABLET | ORAL | 0 refills | Status: DC
Start: 2021-04-03 — End: 2021-04-15

## 2021-04-03 NOTE — Progress Notes (Signed)
Patient is here in-person for PV. Patient denies any allergies to eggs or soy. Patient denies any problems with anesthesia/sedation. Patient is not on any oxygen at home. Patient is not taking any diet/weight loss medications or blood thinners. Patient is aware of our care-partner policy and YIRSW-54 safety protocol.   EMMI education assigned to the patient for the procedure, sent to [place].   Patient is COVID-19 vaccinated.  sutab Prep Prescription coupon was given to the patient.

## 2021-04-09 ENCOUNTER — Encounter: Payer: Self-pay | Admitting: Gastroenterology

## 2021-04-15 ENCOUNTER — Ambulatory Visit (AMBULATORY_SURGERY_CENTER): Payer: BC Managed Care – PPO | Admitting: Gastroenterology

## 2021-04-15 ENCOUNTER — Encounter: Payer: Self-pay | Admitting: Gastroenterology

## 2021-04-15 VITALS — BP 105/57 | HR 70 | Temp 98.2°F | Resp 12 | Ht >= 80 in | Wt 369.0 lb

## 2021-04-15 DIAGNOSIS — D122 Benign neoplasm of ascending colon: Secondary | ICD-10-CM

## 2021-04-15 DIAGNOSIS — Z1211 Encounter for screening for malignant neoplasm of colon: Secondary | ICD-10-CM

## 2021-04-15 HISTORY — PX: COLONOSCOPY: SHX174

## 2021-04-15 MED ORDER — SODIUM CHLORIDE 0.9 % IV SOLN: 500.0000 mL | Freq: Once | INTRAVENOUS | Status: AC

## 2021-04-15 NOTE — Progress Notes (Signed)
To PACU, VSS. Report to [email or web address] 

## 2021-04-15 NOTE — Patient Instructions (Addendum)
Handouts were given to your care partner on polyps and hemorrhoids. You may resume your current medications today. Await biopsy results.  May take 1-3 weeks to receive pathology results. Please call if any questions or concerns.      YOU HAD AN ENDOSCOPIC PROCEDURE TODAY AT [facility]:   Refer to the procedure report that was given to you for any specific questions about what was found during the examination.  If the procedure report does not answer your questions, please call your gastroenterologist to clarify.  If you requested that your care partner not be given the details of your procedure findings, then the procedure report has been included in a sealed envelope for you to review at your convenience later.  YOU SHOULD EXPECT: Some feelings of bloating in the abdomen. Passage of more gas than usual.  Walking can help get rid of the air that was put into your GI tract during the procedure and reduce the bloating. If you had a lower endoscopy (such as a colonoscopy or flexible sigmoidoscopy) you may notice spotting of blood in your stool or on the toilet paper. If you underwent a bowel prep for your procedure, you may not have a normal bowel movement for a few days.  Please Note:  You might notice some irritation and congestion in your nose or some drainage.  This is from the oxygen used during your procedure.  There is no need for concern and it should clear up in a day or so.  SYMPTOMS TO REPORT IMMEDIATELY:  Following lower endoscopy (colonoscopy or flexible sigmoidoscopy):  Excessive amounts of blood in the stool  Significant tenderness or worsening of abdominal pains  Swelling of the abdomen that is new, acute  Fever of 100F or higher   For urgent or emergent issues, a gastroenterologist can be reached at any hour by calling [phone]. Do not use MyChart messaging for urgent concerns.    DIET:  We do recommend a small meal at first, but then you may proceed  to your regular diet.  Drink plenty of fluids but you should avoid alcoholic beverages for 24 hours.  ACTIVITY:  You should plan to take it easy for the rest of today and you should NOT DRIVE or use heavy machinery until tomorrow (because of the sedation medicines used during the test).    FOLLOW UP: Our staff will call the number listed on your records 48-72 hours following your procedure to check on you and address any questions or concerns that you may have regarding the information given to you following your procedure. If we do not reach you, we will leave a message.  We will attempt to reach you two times.  During this call, we will ask if you have developed any symptoms of COVID 19. If you develop any symptoms (ie: fever, flu-like symptoms, shortness of breath, cough etc.) before then, please call [phone].  If you test positive for Covid 19 in the 2 weeks post procedure, please call and report this information to [place].    If any biopsies were taken you will be contacted by phone or by letter within the next 1-3 weeks.  Please call us at [phone] if you have not heard about the biopsies in 3 weeks.    SIGNATURES/CONFIDENTIALITY: You and/or your care partner have signed paperwork which will be entered into your electronic medical record.  These signatures attest to the fact that that the information above on your After Visit  Summary has been reviewed and is understood.  Full responsibility of the confidentiality of this discharge information lies with you and/or your care-partner.

## 2021-04-15 NOTE — Progress Notes (Signed)
[facility] History and Physical   Primary Care Physician:  Lorrene Reid, PA-C   Reason for Procedure:   Colon cancer screening  Plan:    Screening colonoscopy     HPI: David Gonzalez is a 51 y.o. male undergoing initial average risk screening colonoscopy.  He has no family history of colon cancer and no chronic GI symptoms.    Past Medical History:  Diagnosis Date   Allergy    Complication of anesthesia    reported to be violent waking from anesthesia at age 1   Headache(784.0)    Hypertension    Phreesia 06/17/2020   Mental disorder    mildly claustrophobic    Past Surgical History:  Procedure Laterality Date   ANTERIOR CERVICAL DECOMP/DISCECTOMY FUSION  04/30/2011   Procedure: ANTERIOR CERVICAL DECOMPRESSION/DISCECTOMY FUSION 1 LEVEL;  Surgeon: Dahlia Bailiff, MD;  Location: [place];  Service: Orthopedics;  Laterality: N/A;  ACDF C5-6   COLONOSCOPY  04/15/2021   KNEE ARTHROSCOPY     x2   TONSILLECTOMY      Prior to Admission medications   Medication Sig Start Date End Date Taking? Authorizing Provider  aspirin 81 MG tablet Take 81 mg by mouth daily.   Yes [provider]  atorvastatin (LIPITOR) 20 MG tablet TAKE 1 TABLET BY MOUTH EVERYDAY AT BEDTIME 10/08/20  Yes Abonza, Maritza, PA-C  cetirizine (ZYRTEC) 5 MG tablet Take 1 tablet by mouth daily. 09/11/17  Yes [provider]  Cholecalciferol (VITAMIN D3) 5000 units CAPS Take 1 capsule by mouth daily.   Yes [provider]  hydrochlorothiazide (MICROZIDE) 12.5 MG capsule TAKE 1 CAPSULE BY MOUTH EVERY DAY **PLEASE CONTACT OUR OFFICE FOR FUTURE MED REFILLS** 02/11/21  Yes Abonza, Maritza, PA-C  lisinopril (ZESTRIL) 20 MG tablet TAKE 1 TABLET BY MOUTH DAILY. **CONTACT OUR OFFICE TO SCHEDULE A FOLLOW UP FOR FUTURE MED REFILLS** 01/10/21  Yes Abonza, Maritza, PA-C  mometasone (NASONEX) 50 MCG/ACT nasal spray Place 2 sprays into the nose daily. 08/02/20  Yes Lorrene Reid, PA-C     Current Outpatient Medications  Medication Sig Dispense Refill   aspirin 81 MG tablet Take 81 mg by mouth daily.     atorvastatin (LIPITOR) 20 MG tablet TAKE 1 TABLET BY MOUTH EVERYDAY AT BEDTIME 90 tablet 0   cetirizine (ZYRTEC) 5 MG tablet Take 1 tablet by mouth daily.     Cholecalciferol (VITAMIN D3) 5000 units CAPS Take 1 capsule by mouth daily.     hydrochlorothiazide (MICROZIDE) 12.5 MG capsule TAKE 1 CAPSULE BY MOUTH EVERY DAY **PLEASE CONTACT OUR OFFICE FOR FUTURE MED REFILLS** 15 capsule 0   lisinopril (ZESTRIL) 20 MG tablet TAKE 1 TABLET BY MOUTH DAILY. **CONTACT OUR OFFICE TO SCHEDULE A FOLLOW UP FOR FUTURE MED REFILLS** 15 tablet 0   mometasone (NASONEX) 50 MCG/ACT nasal spray Place 2 sprays into the nose daily. 17 g 2   Current Facility-Administered Medications  Medication Dose Route Frequency Provider Last Rate Last Admin   0.9 %  sodium chloride infusion  500 mL Intravenous Once Daryel November, MD        Allergies as of 04/15/2021 - Review Complete 04/15/2021  Allergen Reaction Noted   Other Rash 06/17/2020   Hydrocodone Other (See Comments) 04/18/2011    Family History  Problem Relation Age of Onset   Diabetes Mother    Hypertension Mother    Arthritis Mother    Neuropathy Mother    Hypertension Father    Cancer Father  Hypertension Sister    Hypertension Maternal Grandmother    Schizophrenia Maternal Grandfather    Hypertension Paternal Grandmother    Hypertension Paternal Grandfather    Colon cancer Neg Hx    Colon polyps Neg Hx    Esophageal cancer Neg Hx    Rectal cancer Neg Hx    Stomach cancer Neg Hx     Social History   Socioeconomic History   Marital status: Married    Spouse name: Not on file   Number of children: Not on file   Years of education: Not on file   Highest education level: Not on file  Occupational History   Not on file  Tobacco Use   Smoking status: Never   Smokeless tobacco: Never  Vaping Use   Vaping Use:  Never used  Substance and Sexual Activity   Alcohol use: No   Drug use: No   Sexual activity: Yes    Birth control/protection: Pill  Other Topics Concern   Not on file  Social History Narrative   Not on file   Social Determinants of Health   Financial Resource Strain: Not on file  Food Insecurity: Not on file  Transportation Needs: Not on file  Physical Activity: Not on file  Stress: Not on file  Social Connections: Not on file  Intimate Partner Violence: Not on file    Review of Systems:  All other review of systems negative except as mentioned in the HPI.  Physical Exam: Vital signs BP (!) 86/51    Pulse 70    Temp 98.2 F (36.8 C) (Temporal)    Resp 14    Ht 6\' 8"  (2.032 m)    Wt (!) 369 lb (167.4 kg)    SpO2 98%    BMI 40.54 kg/m   General:   Alert,  Well-developed, well-nourished, pleasant and cooperative in NAD Airway:  Mallampati 3 Lungs:  Clear throughout to auscultation.   Heart:  Regular rate and rhythm; no murmurs, clicks, rubs,  or gallops. Abdomen:  Soft, nontender and nondistended. Normal bowel sounds.   Neuro/Psych:  Normal mood and affect. A and O x 3   Pierre Cumpton E. Candis Schatz, MD [facility]

## 2021-04-15 NOTE — Progress Notes (Signed)
Called to room to assist during endoscopic procedure.  Patient ID and intended procedure confirmed with present staff. Received instructions for my participation in the procedure from the performing physician.  

## 2021-04-15 NOTE — Op Note (Signed)
[facility] Patient Name: David Gonzalez Procedure Date: 04/15/2021 9:18 AM MRN: [id number] Endoscopist: Nicki Reaper E. Candis Schatz , MD Age: 51 Referring MD:  Date of Birth: Nov 26, 1970 Gender: Male Account #: [id number] Procedure:                Colonoscopy Indications:              Screening for colorectal malignant neoplasm, This                            is the patient's first colonoscopy Medicines:                Monitored Anesthesia Care Procedure:                Pre-Anesthesia Assessment:                           - Prior to the procedure, a History and Physical                            was performed, and patient medications and                            allergies were reviewed. The patient's tolerance of                            previous anesthesia was also reviewed. The risks                            and benefits of the procedure and the sedation                            options and risks were discussed with the patient.                            All questions were answered, and informed consent                            was obtained. Prior Anticoagulants: The patient has                            taken no previous anticoagulant or antiplatelet                            agents. ASA Grade Assessment: III - A patient with                            severe systemic disease. After reviewing the risks                            and benefits, the patient was deemed in                            satisfactory condition to undergo the procedure.  After obtaining informed consent, the colonoscope                            was passed under direct vision. Throughout the                            procedure, the patient's blood pressure, pulse, and                            oxygen saturations were monitored continuously. The                            Olympus CF-HQ190L ([id number]) Colonoscope was                            introduced through the  anus and advanced to the the                            terminal ileum, with identification of the                            appendiceal orifice and IC valve. The colonoscopy                            was performed without difficulty. The patient                            tolerated the procedure well. The quality of the                            bowel preparation was adequate. The terminal ileum,                            ileocecal valve, appendiceal orifice, and rectum                            were photographed. The bowel preparation used was                            Sutab via split dose instruction. Scope In: 9:31:22 AM Scope Out: 9:52:00 AM Scope Withdrawal Time: 0 hours 14 minutes 16 seconds  Total Procedure Duration: 0 hours 20 minutes 38 seconds  Findings:                 The perianal and digital rectal examinations were                            normal. Pertinent negatives include normal                            sphincter tone and no palpable rectal lesions.                           A 2 mm polyp was found in the ascending colon. The  polyp was sessile. The polyp was removed with a                            cold snare. Resection and retrieval were complete.                            Estimated blood loss was minimal.                           The exam was otherwise normal throughout the                            examined colon.                           The terminal ileum appeared normal.                           Non-bleeding internal hemorrhoids were found during                            retroflexion. The hemorrhoids were Grade I                            (internal hemorrhoids that do not prolapse).                           No additional abnormalities were found on                            retroflexion. Complications:            No immediate complications. Estimated Blood Loss:     Estimated blood loss was minimal. Impression:                - One 2 mm polyp in the ascending colon, removed                            with a cold snare. Resected and retrieved.                           - The examined portion of the ileum was normal.                           - Non-bleeding internal hemorrhoids. Recommendation:           - Patient has a contact number available for                            emergencies. The signs and symptoms of potential                            delayed complications were discussed with the                            patient. Return to normal activities tomorrow.  Written discharge instructions were provided to the                            patient.                           - Resume previous diet.                           - Continue present medications.                           - Await pathology results.                           - Repeat colonoscopy in 10 years for surveillance. [name] E. Candis Schatz, MD 04/15/2021 9:57:52 AM This report has been signed electronically.

## 2021-04-15 NOTE — Progress Notes (Signed)
David Crane, CRNA spoke with pt and his wife, who is a [employer] scientist, about PVS's.  At time it was trigeminy.  The strip demonstrated trigeminy - they will take to pt's primary care md.  maw

## 2021-04-15 NOTE — Progress Notes (Signed)
No problems noted in the recovery room. maw 

## 2021-04-15 NOTE — Progress Notes (Signed)
Pt's states no medical or surgical changes since previsit or office visit.  Vitals DT 

## 2021-04-17 ENCOUNTER — Telehealth: Payer: Self-pay | Admitting: *Deleted

## 2021-04-17 NOTE — Telephone Encounter (Signed)
°  Follow up Call-  Call back number 04/15/2021  Post procedure Call Back phone  # [phone]  Permission to leave phone message Yes  Some recent data might be hidden     Patient questions:  Do you have a fever, pain , or abdominal swelling? No. Pain Score  0 *  Have you tolerated food without any problems? Yes.    Have you been able to return to your normal activities? Yes.    Do you have any questions about your discharge instructions: Diet   No. Medications  No. Follow up visit  No.  Do you have questions or concerns about your Care? No.  Actions: * If pain score is 4 or above: No action needed, pain <4.

## 2021-04-20 NOTE — Progress Notes (Signed)
David Gonzalez,  One polyp which I removed during your recent procedure was proven to be completely benign but is considered a "pre-cancerous" polyp that MAY have grown into cancer if it had not been removed.  Studies shows that at least 20% of women over age 51 and 30% of men over age 49 have pre-cancerous polyps.  Based on current nationally recognized surveillance guidelines, I recommend that you have a repeat colonoscopy in 10 years.   If you develop any new rectal bleeding, abdominal pain or significant bowel habit changes, please contact me before then.
